# Patient Record
Sex: Female | Born: 1985 | Race: White | Hispanic: No | Marital: Single | Smoking: Current every day smoker
Health system: Southern US, Community
[De-identification: ages and names within clinical notes are randomized; demographics above are authoritative.]

## PROBLEM LIST (undated history)

## (undated) DIAGNOSIS — F1920 Other psychoactive substance dependence, uncomplicated: Secondary | ICD-10-CM

## (undated) HISTORY — PX: TUBAL LIGATION: SHX77

## (undated) HISTORY — PX: CARPAL TUNNEL RELEASE: SHX101

---
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
## 2008-11-28 ENCOUNTER — Emergency Department (HOSPITAL_BASED_OUTPATIENT_CLINIC_OR_DEPARTMENT_OTHER): Admission: EM | Admit: 2008-11-28 | Discharge: 2008-11-28 | Payer: Self-pay | Admitting: Emergency Medicine

## 2008-11-28 ENCOUNTER — Ambulatory Visit: Payer: Self-pay | Admitting: Diagnostic Radiology

## 2008-12-08 ENCOUNTER — Emergency Department (HOSPITAL_BASED_OUTPATIENT_CLINIC_OR_DEPARTMENT_OTHER): Admission: EM | Admit: 2008-12-08 | Discharge: 2008-12-08 | Payer: Self-pay | Admitting: Emergency Medicine

## 2010-04-21 LAB — URINALYSIS, ROUTINE W REFLEX MICROSCOPIC
Bilirubin Urine: NEGATIVE
Bilirubin Urine: NEGATIVE
Glucose, UA: NEGATIVE mg/dL
Ketones, ur: NEGATIVE mg/dL
Nitrite: NEGATIVE
Nitrite: NEGATIVE
Protein, ur: NEGATIVE mg/dL
Specific Gravity, Urine: 1.007 (ref 1.005–1.030)
pH: 6.5 (ref 5.0–8.0)
pH: 7 (ref 5.0–8.0)

## 2010-04-21 LAB — PREGNANCY, URINE: Preg Test, Ur: NEGATIVE

## 2010-04-21 LAB — URINE MICROSCOPIC-ADD ON

## 2012-06-01 ENCOUNTER — Emergency Department (HOSPITAL_BASED_OUTPATIENT_CLINIC_OR_DEPARTMENT_OTHER): Payer: BC Managed Care – PPO

## 2012-06-01 ENCOUNTER — Encounter (HOSPITAL_BASED_OUTPATIENT_CLINIC_OR_DEPARTMENT_OTHER): Payer: Self-pay | Admitting: *Deleted

## 2012-06-01 ENCOUNTER — Inpatient Hospital Stay (HOSPITAL_BASED_OUTPATIENT_CLINIC_OR_DEPARTMENT_OTHER)
Admission: EM | Admit: 2012-06-01 | Discharge: 2012-06-04 | DRG: 277 | Disposition: A | Discharge status: Home / Self Care | Payer: BC Managed Care – PPO | Attending: Internal Medicine | Admitting: Internal Medicine

## 2012-06-01 DIAGNOSIS — L0390 Cellulitis, unspecified: Secondary | ICD-10-CM | POA: Diagnosis present

## 2012-06-01 DIAGNOSIS — IMO0002 Reserved for concepts with insufficient information to code with codable children: Principal | ICD-10-CM | POA: Diagnosis present

## 2012-06-01 DIAGNOSIS — F17200 Nicotine dependence, unspecified, uncomplicated: Secondary | ICD-10-CM | POA: Diagnosis present

## 2012-06-01 DIAGNOSIS — Z881 Allergy status to other antibiotic agents status: Secondary | ICD-10-CM

## 2012-06-01 DIAGNOSIS — L0291 Cutaneous abscess, unspecified: Secondary | ICD-10-CM | POA: Diagnosis present

## 2012-06-01 DIAGNOSIS — F1110 Opioid abuse, uncomplicated: Secondary | ICD-10-CM | POA: Diagnosis present

## 2012-06-01 NOTE — ED Notes (Signed)
Pt c/o abscess to right hand x 3 days

## 2012-06-01 NOTE — ED Provider Notes (Signed)
History     CSN: [id number]  Arrival date & time 06/01/12  2116   First MD Initiated Contact with Patient 06/01/12 2301      Chief Complaint  Patient presents with  . Abscess    (Consider location/radiation/quality/duration/timing/severity/associated sxs/prior treatment) Patient is a 27 y.o. female presenting with abscess. The history is provided by the patient.  Abscess Location:  Shoulder/arm Shoulder/arm abscess location:  R forearm Abscess quality: induration, itching and painful   Red streaking: no   Duration:  5 days Progression:  Worsening Pain details:    Quality:  Pressure   Severity:  Severe   Timing:  Constant   Progression:  Worsening Context: injected drug use   Relieved by:  Nothing Worsened by:  Nothing tried Ineffective treatments:  None tried Associated symptoms: no fever     History reviewed. No pertinent past medical history.  History reviewed. No pertinent past surgical history.  History reviewed. No pertinent family history.  History  Substance Use Topics  . Smoking status: Current Every Day Smoker -- 0.50 packs/day    Types: Cigarettes  . Smokeless tobacco: Not on file  . Alcohol Use: No    OB History   Grav Para Term Preterm Abortions TAB SAB Ect Mult Living                  Review of Systems  Constitutional: Negative for fever.  All other systems reviewed and are negative.    Allergies  Augmentin  Home Medications  No current outpatient prescriptions on file.  BP 126/75  Pulse 89  Temp(Src) 98.7 F (37.1 C) (Oral)  Resp 16  Ht 5\' 7"  (1.702 m)  Wt 150 lb (68.04 kg)  BMI 23.49 kg/m2  SpO2 100%  Physical Exam  Constitutional: She is oriented to person, place, and time. She appears well-developed and well-nourished. No distress.  HENT:  Head: Normocephalic and atraumatic.  Mouth/Throat: Oropharynx is clear and moist.  Eyes: Conjunctivae are normal. Pupils are equal, round, and reactive to light.  Neck: Normal range  of motion. Neck supple.  Cardiovascular: Normal rate, regular rhythm and intact distal pulses.   Pulmonary/Chest: Effort normal and breath sounds normal. She has no wheezes. She has no rales.  Abdominal: Soft. Bowel sounds are normal. There is no tenderness. There is no rebound and no guarding.  Musculoskeletal:       Arms: Neurological: She is alert and oriented to person, place, and time.  Skin: Skin is warm and dry.  Psychiatric: She has a normal mood and affect.    ED Course  Procedures (including critical care time)  Labs Reviewed - No data to display No results found.   No diagnosis found.    MDM  Admit to medicine Case d/w hand surgery Dr. Georg Ruddle Results for orders placed during the hospital encounter of 06/01/12  CBC WITH DIFFERENTIAL      Result Value Range   WBC 9.5  4.0 - 10.5 K/uL   RBC 4.09  3.87 - 5.11 MIL/uL   Hemoglobin 12.7  12.0 - 15.0 g/dL   HCT 40.9  81.1 - 91.4 %   MCV 92.2  78.0 - 100.0 fL   MCH 31.1  26.0 - 34.0 pg   MCHC 33.7  30.0 - 36.0 g/dL   RDW 78.2  95.6 - 21.3 %   Platelets 227  150 - 400 K/uL   Neutrophils Relative % 64  43 - 77 %   Neutro Abs 6.1  1.7 -  7.7 K/uL   Lymphocytes Relative 25  12 - 46 %   Lymphs Abs 2.3  0.7 - 4.0 K/uL   Monocytes Relative 8  3 - 12 %   Monocytes Absolute 0.8  0.1 - 1.0 K/uL   Eosinophils Relative 3  0 - 5 %   Eosinophils Absolute 0.3  0.0 - 0.7 K/uL   Basophils Relative 0  0 - 1 %   Basophils Absolute 0.0  0.0 - 0.1 K/uL  BASIC METABOLIC PANEL      Result Value Range   Sodium 141  135 - 145 mEq/L   Potassium 3.9  3.5 - 5.1 mEq/L   Chloride 104  96 - 112 mEq/L   CO2 27  19 - 32 mEq/L   Glucose, Bld 88  70 - 99 mg/dL   BUN 6  6 - 23 mg/dL   Creatinine, Ser 4.09  0.50 - 1.10 mg/dL   Calcium 9.6  8.4 - 81.1 mg/dL   GFR calc non Af Amer >90  >90 mL/min   GFR calc Af Amer >90  >90 mL/min  PREGNANCY, URINE      Result Value Range   Preg Test, Ur NEGATIVE  NEGATIVE   Dg Forearm Right  06/01/2012    *RADIOLOGY REPORT*  Clinical Data: Abscess right anterior lateral wrist region  RIGHT FOREARM - 2 VIEW  Comparison: None  Findings: There is diffuse soft tissue swelling along the dorsum of the distal forearm.aa  There is no acute fracture or subluxation identified.  No focal bony erosions identified.  There is no radiopaque foreign bodies or soft tissue calcifications.  IMPRESSION:  1.  Dorsal soft tissue swelling.   Original Report Authenticated By: Signa Kell, M.D.     Medications  nicotine (NICODERM CQ - dosed in mg/24 hours) patch 21 mg (21 mg Transdermal Patch Applied 06/02/12 0239)  vancomycin (VANCOCIN) IVPB 1000 mg/200 mL premix (1,000 mg Intravenous New Bag/Given 06/02/12 0242)  levofloxacin (LEVAQUIN) IVPB 500 mg (0 mg Intravenous Stopped 06/02/12 0240)  TDaP (BOOSTRIX) injection 0.5 mL (0.5 mLs Intramuscular Given 06/02/12 0216)  ketorolac (TORADOL) 30 MG/ML injection 30 mg (30 mg Intravenous Given 06/02/12 0217)  fentaNYL (SUBLIMAZE) injection 100 mcg (100 mcg Intravenous Given 06/02/12 0217)           Dang Mathison K Lilianne Delair-Rasch, MD 06/02/12 [phone]

## 2012-06-02 ENCOUNTER — Inpatient Hospital Stay (HOSPITAL_COMMUNITY): Payer: BC Managed Care – PPO

## 2012-06-02 ENCOUNTER — Inpatient Hospital Stay (HOSPITAL_COMMUNITY): Payer: BC Managed Care – PPO | Admitting: Anesthesiology

## 2012-06-02 ENCOUNTER — Encounter (HOSPITAL_COMMUNITY)
Admission: EM | Disposition: A | Discharge status: Home / Self Care | Payer: Self-pay | Source: Home / Self Care | Attending: Internal Medicine

## 2012-06-02 ENCOUNTER — Encounter (HOSPITAL_COMMUNITY): Payer: Self-pay | Admitting: Anesthesiology

## 2012-06-02 DIAGNOSIS — L0390 Cellulitis, unspecified: Secondary | ICD-10-CM | POA: Diagnosis present

## 2012-06-02 DIAGNOSIS — F17200 Nicotine dependence, unspecified, uncomplicated: Secondary | ICD-10-CM

## 2012-06-02 DIAGNOSIS — F1110 Opioid abuse, uncomplicated: Secondary | ICD-10-CM

## 2012-06-02 DIAGNOSIS — L0291 Cutaneous abscess, unspecified: Secondary | ICD-10-CM

## 2012-06-02 HISTORY — PX: I & D EXTREMITY: SHX5045

## 2012-06-02 LAB — CBC
MCH: 30 pg (ref 26.0–34.0)
MCV: 90.3 fL (ref 78.0–100.0)
Platelets: 212 10*3/uL (ref 150–400)
RDW: 13.3 % (ref 11.5–15.5)

## 2012-06-02 LAB — BASIC METABOLIC PANEL
BUN: 6 mg/dL (ref 6–23)
CO2: 27 mEq/L (ref 19–32)
CO2: 23 mEq/L (ref 19–32)
Calcium: 9 mg/dL (ref 8.4–10.5)
Chloride: 104 mEq/L (ref 96–112)
Creatinine, Ser: 0.59 mg/dL (ref 0.50–1.10)
Glucose, Bld: 88 mg/dL (ref 70–99)
Glucose, Bld: 100 mg/dL — ABNORMAL HIGH (ref 70–99)
Potassium: 3.9 mEq/L (ref 3.5–5.1)

## 2012-06-02 LAB — CBC WITH DIFFERENTIAL/PLATELET
Hemoglobin: 12.7 g/dL (ref 12.0–15.0)
Lymphs Abs: 2.3 10*3/uL (ref 0.7–4.0)
Monocytes Relative: 8 % (ref 3–12)
Neutro Abs: 6.1 10*3/uL (ref 1.7–7.7)
Neutrophils Relative %: 64 % (ref 43–77)
RBC: 4.09 MIL/uL (ref 3.87–5.11)

## 2012-06-02 SURGERY — IRRIGATION AND DEBRIDEMENT EXTREMITY
Anesthesia: General | Site: Arm Lower | Laterality: Right | Wound class: Dirty or Infected

## 2012-06-02 MED ORDER — LEVOFLOXACIN IN D5W 500 MG/100ML IV SOLN
500.0000 mg | Freq: Once | INTRAVENOUS | Status: AC
  Administered 2012-06-02: 500 mg via INTRAVENOUS
  Filled 2012-06-02: qty 100

## 2012-06-02 MED ORDER — NICOTINE 21 MG/24HR TD PT24
21.0000 mg | MEDICATED_PATCH | Freq: Once | TRANSDERMAL | Status: AC
  Administered 2012-06-02: 21 mg via TRANSDERMAL
  Filled 2012-06-02 (×2): qty 1

## 2012-06-02 MED ORDER — DIPHENHYDRAMINE HCL 50 MG/ML IJ SOLN
25.0000 mg | Freq: Four times a day (QID) | INTRAMUSCULAR | Status: DC | PRN
  Administered 2012-06-02 – 2012-06-03 (×2): 25 mg via INTRAVENOUS
  Filled 2012-06-02: qty 1

## 2012-06-02 MED ORDER — SODIUM CHLORIDE 0.9 % IV SOLN
INTRAVENOUS | Status: DC
  Administered 2012-06-02: 22:00:00 via INTRAVENOUS
  Administered 2012-06-02: 125 mL/h via INTRAVENOUS
  Administered 2012-06-03: 07:00:00 via INTRAVENOUS

## 2012-06-02 MED ORDER — FENTANYL CITRATE 0.05 MG/ML IJ SOLN
INTRAMUSCULAR | Status: DC | PRN
  Administered 2012-06-02: 50 ug via INTRAVENOUS
  Administered 2012-06-02 (×2): 100 ug via INTRAVENOUS

## 2012-06-02 MED ORDER — VANCOMYCIN HCL IN DEXTROSE 1-5 GM/200ML-% IV SOLN
1000.0000 mg | Freq: Once | INTRAVENOUS | Status: AC
  Administered 2012-06-02: 1000 mg via INTRAVENOUS
  Filled 2012-06-02: qty 200

## 2012-06-02 MED ORDER — HYDROMORPHONE HCL PF 1 MG/ML IJ SOLN
INTRAMUSCULAR | Status: AC
  Filled 2012-06-02: qty 1

## 2012-06-02 MED ORDER — DIPHENHYDRAMINE HCL 50 MG/ML IJ SOLN
INTRAMUSCULAR | Status: AC
  Filled 2012-06-02: qty 1

## 2012-06-02 MED ORDER — ENOXAPARIN SODIUM 40 MG/0.4ML ~~LOC~~ SOLN
40.0000 mg | Freq: Every day | SUBCUTANEOUS | Status: DC
  Administered 2012-06-02: 40 mg via SUBCUTANEOUS
  Filled 2012-06-02 (×2): qty 0.4

## 2012-06-02 MED ORDER — ACETAMINOPHEN 650 MG RE SUPP: 650.0000 mg | Freq: Four times a day (QID) | RECTAL | Status: DC | PRN

## 2012-06-02 MED ORDER — SUCCINYLCHOLINE CHLORIDE 20 MG/ML IJ SOLN
INTRAMUSCULAR | Status: DC | PRN
  Administered 2012-06-02: 120 mg via INTRAVENOUS

## 2012-06-02 MED ORDER — ONDANSETRON HCL 4 MG/2ML IJ SOLN
4.0000 mg | Freq: Four times a day (QID) | INTRAMUSCULAR | Status: DC | PRN
  Administered 2012-06-02: 4 mg via INTRAVENOUS
  Filled 2012-06-02: qty 2

## 2012-06-02 MED ORDER — ONDANSETRON HCL 4 MG PO TABS: 4.0000 mg | ORAL_TABLET | Freq: Four times a day (QID) | ORAL | Status: DC | PRN

## 2012-06-02 MED ORDER — GADOBENATE DIMEGLUMINE 529 MG/ML IV SOLN
15.0000 mL | Freq: Once | INTRAVENOUS | Status: AC | PRN
  Administered 2012-06-02: 15 mL via INTRAVENOUS

## 2012-06-02 MED ORDER — HYDROMORPHONE HCL PF 1 MG/ML IJ SOLN
0.2500 mg | INTRAMUSCULAR | Status: DC | PRN
  Administered 2012-06-02 (×4): 0.5 mg via INTRAVENOUS

## 2012-06-02 MED ORDER — SODIUM CHLORIDE 0.9 % IR SOLN
Status: DC | PRN
  Administered 2012-06-02: 1000 mL

## 2012-06-02 MED ORDER — CLINDAMYCIN PHOSPHATE 600 MG/50ML IV SOLN
600.0000 mg | Freq: Four times a day (QID) | INTRAVENOUS | Status: DC
  Administered 2012-06-02 – 2012-06-03 (×5): 600 mg via INTRAVENOUS
  Filled 2012-06-02 (×7): qty 50

## 2012-06-02 MED ORDER — MEPERIDINE HCL 25 MG/ML IJ SOLN: 6.2500 mg | INTRAMUSCULAR | Status: DC | PRN

## 2012-06-02 MED ORDER — OXYCODONE HCL 5 MG/5ML PO SOLN: 5.0000 mg | Freq: Once | ORAL | Status: AC | PRN

## 2012-06-02 MED ORDER — KETOROLAC TROMETHAMINE 30 MG/ML IJ SOLN
30.0000 mg | Freq: Once | INTRAMUSCULAR | Status: AC
  Administered 2012-06-02: 30 mg via INTRAVENOUS
  Filled 2012-06-02: qty 1

## 2012-06-02 MED ORDER — OXYCODONE HCL 5 MG PO TABS: 5.0000 mg | ORAL_TABLET | Freq: Once | ORAL | Status: AC | PRN

## 2012-06-02 MED ORDER — MIDAZOLAM HCL 5 MG/5ML IJ SOLN
INTRAMUSCULAR | Status: DC | PRN
  Administered 2012-06-02: 2 mg via INTRAVENOUS

## 2012-06-02 MED ORDER — OXYCODONE HCL 5 MG PO TABS
5.0000 mg | ORAL_TABLET | ORAL | Status: DC | PRN
  Administered 2012-06-02 – 2012-06-04 (×8): 5 mg via ORAL
  Filled 2012-06-02 (×8): qty 1

## 2012-06-02 MED ORDER — FENTANYL CITRATE 0.05 MG/ML IJ SOLN
100.0000 ug | Freq: Once | INTRAMUSCULAR | Status: AC
  Administered 2012-06-02: 100 ug via INTRAVENOUS
  Filled 2012-06-02: qty 2

## 2012-06-02 MED ORDER — ZOLPIDEM TARTRATE 5 MG PO TABS: 5.0000 mg | ORAL_TABLET | Freq: Every evening | ORAL | Status: DC | PRN

## 2012-06-02 MED ORDER — LIDOCAINE HCL (CARDIAC) 20 MG/ML IV SOLN
INTRAVENOUS | Status: DC | PRN
  Administered 2012-06-02: 100 mg via INTRAVENOUS

## 2012-06-02 MED ORDER — ONDANSETRON HCL 4 MG/2ML IJ SOLN: 4.0000 mg | Freq: Once | INTRAMUSCULAR | Status: AC | PRN

## 2012-06-02 MED ORDER — LACTATED RINGERS IV SOLN
INTRAVENOUS | Status: DC | PRN
  Administered 2012-06-02: 17:00:00 via INTRAVENOUS

## 2012-06-02 MED ORDER — TETANUS-DIPHTH-ACELL PERTUSSIS 5-2.5-18.5 LF-MCG/0.5 IM SUSP
0.5000 mL | Freq: Once | INTRAMUSCULAR | Status: AC
  Administered 2012-06-02: 0.5 mL via INTRAMUSCULAR
  Filled 2012-06-02: qty 0.5

## 2012-06-02 MED ORDER — NAPROXEN 500 MG PO TABS
500.0000 mg | ORAL_TABLET | Freq: Two times a day (BID) | ORAL | Status: DC
  Administered 2012-06-02 – 2012-06-04 (×4): 500 mg via ORAL
  Filled 2012-06-02 (×6): qty 1

## 2012-06-02 MED ORDER — ALUM & MAG HYDROXIDE-SIMETH 200-200-20 MG/5ML PO SUSP: 30.0000 mL | Freq: Four times a day (QID) | ORAL | Status: DC | PRN

## 2012-06-02 MED ORDER — ACETAMINOPHEN 325 MG PO TABS
650.0000 mg | ORAL_TABLET | Freq: Four times a day (QID) | ORAL | Status: DC | PRN
  Administered 2012-06-02 – 2012-06-04 (×3): 650 mg via ORAL
  Filled 2012-06-02 (×3): qty 2

## 2012-06-02 MED ORDER — HYDROMORPHONE HCL PF 1 MG/ML IJ SOLN
0.5000 mg | INTRAMUSCULAR | Status: DC | PRN
  Administered 2012-06-02 – 2012-06-04 (×9): 1 mg via INTRAVENOUS
  Filled 2012-06-02 (×11): qty 1

## 2012-06-02 MED ORDER — PROPOFOL 10 MG/ML IV BOLUS
INTRAVENOUS | Status: DC | PRN
  Administered 2012-06-02: 150 mg via INTRAVENOUS

## 2012-06-02 MED ORDER — NICOTINE 14 MG/24HR TD PT24
14.0000 mg | MEDICATED_PATCH | Freq: Every day | TRANSDERMAL | Status: DC
  Administered 2012-06-03 – 2012-06-04 (×3): 14 mg via TRANSDERMAL
  Filled 2012-06-02 (×4): qty 1

## 2012-06-02 SURGICAL SUPPLY — 49 items
BANDAGE ELASTIC 3 VELCRO ST LF (GAUZE/BANDAGES/DRESSINGS) IMPLANT
BANDAGE ELASTIC 4 VELCRO ST LF (GAUZE/BANDAGES/DRESSINGS) ×1 IMPLANT
BANDAGE GAUZE ELAST BULKY 4 IN (GAUZE/BANDAGES/DRESSINGS) ×3 IMPLANT
BNDG CMPR 9X4 STRL LF SNTH (GAUZE/BANDAGES/DRESSINGS) ×1
BNDG COHESIVE 4X5 TAN STRL (GAUZE/BANDAGES/DRESSINGS) ×1 IMPLANT
BNDG ESMARK 4X9 LF (GAUZE/BANDAGES/DRESSINGS) ×1 IMPLANT
CHLORAPREP W/TINT 26ML (MISCELLANEOUS) ×2 IMPLANT
CLOTH BEACON ORANGE TIMEOUT ST (SAFETY) ×2 IMPLANT
COVER SURGICAL LIGHT HANDLE (MISCELLANEOUS) ×2 IMPLANT
CUFF TOURNIQUET SINGLE 18IN (TOURNIQUET CUFF) ×1 IMPLANT
CUFF TOURNIQUET SINGLE 24IN (TOURNIQUET CUFF) IMPLANT
DRAIN PENROSE 1/4X12 LTX STRL (WOUND CARE) ×1 IMPLANT
DRAPE SURG 17X23 STRL (DRAPES) ×2 IMPLANT
DRSG ADAPTIC 3X8 NADH LF (GAUZE/BANDAGES/DRESSINGS) ×2 IMPLANT
ELECT REM PT RETURN 9FT ADLT (ELECTROSURGICAL)
ELECTRODE REM PT RTRN 9FT ADLT (ELECTROSURGICAL) IMPLANT
EVACUATOR 1/8 PVC DRAIN (DRAIN) IMPLANT
GLOVE BIO SURGEON STRL SZ7.5 (GLOVE) ×2 IMPLANT
GLOVE BIOGEL PI IND STRL 8 (GLOVE) ×1 IMPLANT
GLOVE BIOGEL PI INDICATOR 8 (GLOVE) ×1
GOWN PREVENTION PLUS XXLARGE (GOWN DISPOSABLE) ×2 IMPLANT
GOWN STRL NON-REIN LRG LVL3 (GOWN DISPOSABLE) ×6 IMPLANT
HANDPIECE INTERPULSE COAX TIP (DISPOSABLE) ×2
KIT BASIN OR (CUSTOM PROCEDURE TRAY) ×2 IMPLANT
KIT ROOM TURNOVER OR (KITS) ×2 IMPLANT
MANIFOLD NEPTUNE II (INSTRUMENTS) ×2 IMPLANT
NS IRRIG 1000ML POUR BTL (IV SOLUTION) ×2 IMPLANT
PACK ORTHO EXTREMITY (CUSTOM PROCEDURE TRAY) ×2 IMPLANT
PAD ARMBOARD 7.5X6 YLW CONV (MISCELLANEOUS) ×4 IMPLANT
PAD CAST 3X4 CTTN HI CHSV (CAST SUPPLIES) IMPLANT
PAD CAST 4YDX4 CTTN HI CHSV (CAST SUPPLIES) IMPLANT
PADDING CAST COTTON 3X4 STRL (CAST SUPPLIES) ×2
PADDING CAST COTTON 4X4 STRL (CAST SUPPLIES) ×2
SET HNDPC FAN SPRY TIP SCT (DISPOSABLE) IMPLANT
SPLINT FIBERGLASS 3X12 (CAST SUPPLIES) ×1 IMPLANT
SPONGE GAUZE 4X4 12PLY (GAUZE/BANDAGES/DRESSINGS) ×2 IMPLANT
SPONGE LAP 18X18 X RAY DECT (DISPOSABLE) IMPLANT
STOCKINETTE IMPERVIOUS 9X36 MD (GAUZE/BANDAGES/DRESSINGS) ×1 IMPLANT
SUT ETHILON 3 0 PS 1 (SUTURE) ×1 IMPLANT
SUT ETHILON 4 0 PS 2 18 (SUTURE) IMPLANT
SUT VICRYL RAPIDE 4/0 PS 2 (SUTURE) IMPLANT
SWAB CULTURE LIQUID MINI MALE (MISCELLANEOUS) ×1 IMPLANT
TOWEL OR 17X24 6PK STRL BLUE (TOWEL DISPOSABLE) ×2 IMPLANT
TOWEL OR 17X26 10 PK STRL BLUE (TOWEL DISPOSABLE) ×2 IMPLANT
TUBE ANAEROBIC SPECIMEN COL (MISCELLANEOUS) ×1 IMPLANT
TUBE CONNECTING 12X1/4 (SUCTIONS) ×2 IMPLANT
UNDERPAD 30X30 INCONTINENT (UNDERPADS AND DIAPERS) ×2 IMPLANT
WATER STERILE IRR 1000ML POUR (IV SOLUTION) ×2 IMPLANT
YANKAUER SUCT BULB TIP NO VENT (SUCTIONS) ×2 IMPLANT

## 2012-06-02 NOTE — ED Notes (Signed)
MD at bedside. 

## 2012-06-02 NOTE — Anesthesia Procedure Notes (Signed)
Procedure Name: Intubation Date/Time: 06/02/2012 5:28 PM Performed by: Alanda Amass A Pre-anesthesia Checklist: Patient identified, Timeout performed, Emergency Drugs available, Suction available and Patient being monitored Patient Re-evaluated:Patient Re-evaluated prior to inductionOxygen Delivery Method: Circle system utilized Preoxygenation: Pre-oxygenation with 100% oxygen Intubation Type: IV induction, Rapid sequence and Cricoid Pressure applied Laryngoscope Size: Mac and 3 Grade View: Grade I Tube type: Oral Tube size: 7.5 mm Number of attempts: 1 Airway Equipment and Method: Stylet Placement Confirmation: ETT inserted through vocal cords under direct vision,  breath sounds checked- equal and bilateral and positive ETCO2 Secured at: 22 cm Tube secured with: Tape Dental Injury: Teeth and Oropharynx as per pre-operative assessment

## 2012-06-02 NOTE — Transfer of Care (Signed)
Immediate Anesthesia Transfer of Care Note  Patient: Janice Harrison  Procedure(s) Performed: Procedure(s): IRRIGATION AND DEBRIDEMENT EXTREMITY (Right)  Patient Location: PACU  Anesthesia Type:General  Level of Consciousness: awake  Airway & Oxygen Therapy: Patient Spontanous Breathing  Post-op Assessment: Report given to PACU RN and Post -op Vital signs reviewed and stable  Post vital signs: Reviewed and stable  Complications: No apparent anesthesia complications

## 2012-06-02 NOTE — Progress Notes (Signed)
Dr Rhona Leavens returned page, pt has regular diet until decision is made to operate or not.

## 2012-06-02 NOTE — Anesthesia Postprocedure Evaluation (Signed)
Anesthesia Post Note  Patient: Janice Harrison  Procedure(s) Performed: Procedure(s) (LRB): IRRIGATION AND DEBRIDEMENT EXTREMITY (Right)  Anesthesia type: general  Patient location: PACU  Post pain: Pain level controlled  Post assessment: Patient's Cardiovascular Status Stable  Last Vitals:  Filed Vitals:   06/02/12 1809  BP: 116/80  Pulse: 122  Temp:   Resp: 18    Post vital signs: Reviewed and stable  Level of consciousness: sedated  Complications: No apparent anesthesia complications

## 2012-06-02 NOTE — Progress Notes (Signed)
[facility] PROGRESS NOTE  Janice Harrison ZOX:[id number] DOB: 1985/08/20 DOA: 06/01/2012 PCP: No primary provider on file.  Assessment/Plan: 1. Right wrist cellulitis/abscess: As mentioned in the H&P, the patient will be seen by the hand surgeon. There is no active drainage at this time. For now, we will continue patient's current antibiotics including vancomycin, clindamycin as well as Levaquin. 2.  polysubstance abuse: As per H&P, the patient has a history of heroin abuse. She will benefit greatly from a substance abuse program. 3. DVT prophylaxis: Continue Lovenox as tolerated 4. FEN: For the time being, we'll keep the patient n.p.o. pending recommendations from surgery.  Code Status: Full Family Communication: Patient in room (indicate person spoken with, relationship, and if by phone, the number) Disposition Plan: Pending   Consultants:  Orthopedic surgery  Procedures:  Pending  Antibiotics:  Vancomycin 06/02/12>>>  Levaquin 06/02/12>>>  Clindamycin 06/02/12>>>  HPI/Subjective: Patient complains of right wrist pain and swelling.  Objective: Filed Vitals:   06/01/12 2120 06/02/12 0425  BP: 126/75 114/60  Pulse: 89 77  Temp: 98.7 F (37.1 C) 98.2 F (36.8 C)  TempSrc: Oral   Resp: 16 18  Height: 5\' 7"  (1.702 m)   Weight: 68.04 kg (150 lb)   SpO2: 100% 100%   No intake or output data in the 24 hours ending 06/02/12 0930 Filed Weights   06/01/12 2120  Weight: 68.04 kg (150 lb)    Exam:   General:  Patient's awake in no apparent distress  Cardiovascular: Regular S1-S2  Respiratory: Normal respiratory effort, no crackles no wheeze  Abdomen: Soft positive bowel sound Musculoskeletal: No clubbing or cyanosis, distally well Data Reviewed: Basic Metabolic Panel:  Recent Labs Lab 06/02/12 0040  NA 141  K 3.9  CL 104  CO2 27  GLUCOSE 88  BUN 6  CREATININE 0.60  CALCIUM 9.6   Liver Function Tests: No results found for this basename: AST,  ALT, ALKPHOS, BILITOT, PROT, ALBUMIN,  in the last 168 hours No results found for this basename: LIPASE, AMYLASE,  in the last 168 hours No results found for this basename: AMMONIA,  in the last 168 hours CBC:  Recent Labs Lab 06/02/12 0040 06/02/12 0845  WBC 9.5 7.2  NEUTROABS 6.1  --   HGB 12.7 12.1  HCT 37.7 36.5  MCV 92.2 90.3  PLT 227 212   Cardiac Enzymes: No results found for this basename: CKTOTAL, CKMB, CKMBINDEX, TROPONINI,  in the last 168 hours BNP (last 3 results) No results found for this basename: PROBNP,  in the last 8760 hours CBG: No results found for this basename: GLUCAP,  in the last 168 hours  No results found for this or any previous visit (from the past 240 hour(s)).   Studies: Dg Forearm Right  06/01/2012   *RADIOLOGY REPORT*  Clinical Data: Abscess right anterior lateral wrist region  RIGHT FOREARM - 2 VIEW  Comparison: None  Findings: There is diffuse soft tissue swelling along the dorsum of the distal forearm.aa  There is no acute fracture or subluxation identified.  No focal bony erosions identified.  There is no radiopaque foreign bodies or soft tissue calcifications.  IMPRESSION:  1.  Dorsal soft tissue swelling.   Original Report Authenticated By: Signa Kell, M.D.    Scheduled Meds: . clindamycin (CLEOCIN) IV  600 mg Intravenous Q6H  . enoxaparin (LOVENOX) injection  40 mg Subcutaneous Daily  . nicotine  14 mg Transdermal Daily  . nicotine  21 mg Transdermal Once   Continuous  Infusions: . sodium chloride 125 mL/hr (06/02/12 0924)    Principal Problem:   Cellulitis and abscess Active Problems:   Heroin abuse   Tobacco use disorder    Time spent: 25 minutes    CHIU, STEPHEN K  [facility] Pager [phone]. If 7PM-7AM, please contact night-coverage at [email or web address], password [facility] 06/02/2012, 9:30 AM  LOS: 1 day

## 2012-06-02 NOTE — Consult Note (Signed)
ORTHOPAEDIC CONSULTATION  REQUESTING PHYSICIAN: Jerald Kief, MD  Chief Complaint: right forearm swelling  HPI: Janice Harrison is a 27 y.o. female who complains of  Right forearm pain and swelling.  She was admitted thru ED to hospitalists overnight for IV antibiotics and concern of dorsal wrist abscess.  Events originally conveyed to me were that a self-drainage (rupturing) procedure had been performed, so ED did no additional I&D.  I was c/s by hospitalist today around lunchtime to evaluate this patient.  MRI was requested and has been performed.  Patient is not systemically ill.  History reviewed. No pertinent past medical history. History reviewed. No pertinent past surgical history. History   Social History  . Marital Status: Single    Spouse Name: N/A    Number of Children: N/A  . Years of Education: N/A   Social History Main Topics  . Smoking status: Current Every Day Smoker -- 0.50 packs/day    Types: Cigarettes  . Smokeless tobacco: None  . Alcohol Use: No  . Drug Use: Yes    Special: IV  . Sexually Active: No   Other Topics Concern  . None   Social History Narrative  . None   History reviewed. No pertinent family history. Allergies  Allergen Reactions  . Augmentin (Amoxicillin-Pot Clavulanate)   . Vancomycin     Itching   Prior to Admission medications   Medication Sig Start Date End Date Taking? Authorizing Provider  B Complex-Biotin-FA (B-COMPLEX PO) Take 1 tablet by mouth daily.   Yes Historical Provider, MD  Cyanocobalamin (VITAMIN B 12 PO) Take 2 tablets by mouth daily.   Yes Historical Provider, MD  ibuprofen (ADVIL,MOTRIN) 200 MG tablet Take 600 mg by mouth 2 (two) times daily as needed for pain or headache.   Yes Historical Provider, MD   Dg Forearm Right  06/01/2012   *RADIOLOGY REPORT*  Clinical Data: Abscess right anterior lateral wrist region  RIGHT FOREARM - 2 VIEW  Comparison: None  Findings: There is diffuse soft tissue swelling along the  dorsum of the distal forearm.aa  There is no acute fracture or subluxation identified.  No focal bony erosions identified.  There is no radiopaque foreign bodies or soft tissue calcifications.  IMPRESSION:  1.  Dorsal soft tissue swelling.   Original Report Authenticated By: Signa Kell, M.D.   Mr Wrist Right W Wo Contrast  06/02/2012   *RADIOLOGY REPORT*  Clinical Data: Abscess and cellulitis in the right forearm and wrist.  MRI OF THE RIGHT WRIST WITHOUT AND WITH CONTRAST  Technique:  Multiplanar, multisequence MR imaging was performed both before and after administration of intravenous contrast.  Contrast: 15mL MULTIHANCE GADOBENATE DIMEGLUMINE 529 MG/ML IV SOLN  Comparison: Radiographs dated 06/01/2012  Findings: There is extensive cellulitis primarily of the dorsum of the hand and of the dorsum of the forearm.  There are multiple pockets of pus extensively on the dorsum of the hand and just superficial to the extensor tendons.  Many of these areas of pus are not well defined.  There is a prominent 9 mm pus pocket on the volar aspect of the wrist dorsal to the triquetrum and in the subcutaneous fat.  There is another pus pocket dorsal to the distal radius 13 mm proximal to the radiocarpal joint.  This pocket measures 8 x 6 x 18 mm.  After contrast administration there is intense enhancement of the subcutaneous fat of the dorsum of the hand and extending almost circumferentially around the distal forearm consistent  with cellulitis.  There is enhancement around the extensor tendons on the dorsum of the hand.  No osseous abnormality.  No abnormal enhancement of radiocarpal joint or midcarpal joint or the carpal metacarpal joints.  IMPRESSION:   Extensive cellulitis with multiple areas of pus in the dorsum of the hand and distal forearm as described.  The cellulitis extends around the extensor tendons on the dorsum of the hand.   Original Report Authenticated By: Francene Boyers, M.D.   Mr Forearm Right Wo/w  Cm  06/02/2012   *RADIOLOGY REPORT*  Clinical Data: Cellulitis and abscess is of the forearm and hand.  MRI OF THE RIGHT FOREARM WITHOUT AND WITH CONTRAST  Technique:  Multiplanar, multisequence MR imaging was performed both before and after administration of intravenous contrast.  Contrast: 15mL MULTIHANCE GADOBENATE DIMEGLUMINE 529 MG/ML IV SOLN  Comparison: None.  Findings: There is extensive cellulitis on the dorsum of the forearm extending 16 cm proximal to the radiocarpal joint.  There is no osseous or muscle involvement.  There is a pus pocket just deep to the vein on the dorsum of the distal forearm just proximal to the radiocarpal joint.  There are also pus collections extending just superficial to the fascia of the dorsal muscles of the forearm.  This does extend 16 cm proximal to the radiocarpal joint. There is diffuse extensive cellulitis in the subcutaneous fat of the forearm.  IMPRESSION: Extensive cellulitis of the dorsum of the forearm with pus just superficial to the fascia of the extensor muscles extending from the wrist joint 16 cm proximally.  Focal abscess just dorsal to the distal radius.   Original Report Authenticated By: Francene Boyers, M.D.    Positive ROS: All other systems have been reviewed and were otherwise negative with the exception of those mentioned in the HPI and as above.  Physical Exam: Vitals: Refer to EMR. Constitutional:  WD, WN, NAD HEENT:  NCAT, EOMI Neuro/Psych:  Alert & oriented to person, place, and time; appropriate mood & affect Lymphatic: No generalized UE edema or lymphadenopathy Extremities / MSK:  The extremities are normal with respect to appearance, ranges of motion, joint stability, muscle strength/tone, sensation, & perfusion except as otherwise noted:   marked edema of SQ from dorsal hand extending proximally to mid forearm.  No crepitus.  Obvious central focus of fluctuance at level of carpus.  Full flexion of digits limited by tightness and  pain.  She can extend the digits reasonably well.  NVI.  Assessment: Right forearm abscess(es)  Plan: Discussed with patient and mom plan to incise and drain, possibly with more than one incision in OR shortly.  Will obtain fluid for culture.  Remainder of management per hospitalist and/or ID, including transition to outpatient antibiotic management and followup.  Cliffton Asters Janee Morn, MD     Mobile [phone] Orthopaedic & Hand Surgery [facility] Orthopaedic & Sports Medicine [facility] [address] [place], [place]  [address] [phone]

## 2012-06-02 NOTE — ED Notes (Signed)
Pt given meal and drink per MD approval.

## 2012-06-02 NOTE — Progress Notes (Signed)
Called ortho MD, Suzan Garibaldi, he stated he has not seen her and has not been consulted, she is under hospital care.  Paged hospitalist MD, will continue to follow.

## 2012-06-02 NOTE — Op Note (Signed)
06/01/2012 - 06/02/2012  6:10 PM  PATIENT:  Janice Harrison  27 y.o. female  PRE-OPERATIVE DIAGNOSIS:  Right distal forearm abscess  POST-OPERATIVE DIAGNOSIS:  Same  PROCEDURE:  Right distal forearm incision and drainage, acquisition of cultures  SURGEON: Cliffton Asters. Janee Morn, MD  PHYSICIAN ASSISTANT: None  ANESTHESIA:  general  SPECIMENS: Swabs to microbiology  DRAINS:   Penrose x 2  PREOPERATIVE INDICATIONS:  [name] is a  27 y.o. female with a diagnosis of right forearm abscess who failed conservative measures and elected for surgical management.    The risks benefits and alternatives were discussed with the patient preoperatively including but not limited to the risks of infection, bleeding, nerve injury, cardiopulmonary complications, the need for revision surgery, among others, and the patient verbalized understanding and consented to proceed.  OPERATIVE IMPLANTS: None  OPERATIVE FINDINGS: Thick purulent material from the wrist abscess, edematous fluid throughout the subcutaneous tissues  OPERATIVE PROCEDURE:  The patient was escorted to the operative theatre and placed in a supine position.  A surgical "time-out" was performed during which the planned procedure, proposed operative site, and the correct patient identity were compared to the operative consent and agreement confirmed by the circulating nurse according to current facility policy.  The exposed skin was prepped with Chloraprep and draped in the usual sterile fashion.  A tourniquet was not used.  A longitudinal incision was made over the apex of the dorsal wrist abscess and the small central area was elliptically excised. Thick purulent material was encountered just in the subcutaneous tissue, and cultures were obtained. Digital dissection was used to probe for any other abscesses in this region. In addition incision was made somewhat proximal to the first and subcutaneous digital dissection connected the tube. The  deep fascia at this point was also incised and there was no purulence within it. Everything was copiously irrigated and hemostasis obtained. The Penrose drains left in each wound and the wound is closed around the drain. A bulky dressing with a volar plaster component was applied and she was taken to room stable condition.  Disposition:  She will return to the floor to continue medical infection management.

## 2012-06-02 NOTE — H&P (Signed)
[facility] History and Physical  Odeal Welden WUJ:[id number] DOB: Feb 05, 1985 DOA: 06/01/2012  Referring physician: EDP PCP: No primary provider on file.  Specialists:   Chief Complaint: Swelling Redness and Pain Right ForeArm      HPI: Janice Harrison is a 27 y.o. female with a history of Heroin Abuse who presents to the ED with complaints of increased swelling redness and pain of her right Forearm and Wrist over the past week.   She denies having fever or chills.   She tried to drain the nodule herself but only yielded a small amount of pus.  She reports that she last used heroin 2 weeks ago.   She was evaluated in the ED at [facility] and placed on IV Vancomycin and IV Levaquin and transferred for admission.   The EDP contacted Ortho/Hand surgery for consultation (Dr. Mack Hook).      Review of Systems: The patient denies anorexia, fever, weight loss,, vision loss, decreased hearing, hoarseness, chest pain, syncope, dyspnea on exertion, peripheral edema, balance deficits, hemoptysis, abdominal pain, melena, hematochezia, severe indigestion/heartburn, hematuria, incontinence, genital sores, muscle weakness, suspicious skin lesions, transient blindness, difficulty walking, depression, unusual weight change, abnormal bleeding, enlarged lymph nodes, angioedema, and breast masses.    History reviewed. No pertinent past medical history. History reviewed. No pertinent past surgical history. Social History:  reports that she has been smoking Cigarettes.  She has been smoking about 0.50 packs per day. She does not have any smokeless tobacco history on file. She reports that she uses illicit drugs (IV). She reports that she does not drink alcohol.  where does patient live--home, ALF, SNF? and with whom if at home?  Can patient participate in ADLs?  Allergies  Allergen Reactions  . Augmentin (Amoxicillin-Pot Clavulanate)   . Vancomycin     Itching     Medications:  HOME  MEDS: Prior to Admission medications   Not on File    Allergies:  Allergies  Allergen Reactions  . Augmentin (Amoxicillin-Pot Clavulanate)   . Vancomycin     Itching    Social History:   reports that she has been smoking Cigarettes.  She has been smoking about 0.50 packs per day. She does not have any smokeless tobacco history on file. She reports that she uses illicit drugs (IV). She reports that she does not drink alcohol.  Family History: History reviewed. No pertinent family history.  Review of Systems:  The patient denies anorexia, fever, weight loss,, vision loss, decreased hearing, hoarseness, chest pain, syncope, dyspnea on exertion, peripheral edema, balance deficits, hemoptysis, abdominal pain, melena, hematochezia, severe indigestion/heartburn, hematuria, incontinence, genital sores, muscle weakness, suspicious skin lesions, transient blindness, difficulty walking, depression, unusual weight change, abnormal bleeding, enlarged lymph nodes, angioedema, and breast masses.   Physical Exam:  GEN:  Pleasant thinbut well developed 27 year old Caucasian Female examined  and in no acute distress; cooperative with exam Filed Vitals:   06/01/12 2120 06/02/12 0425  BP: 126/75 114/60  Pulse: 89 77  Temp: 98.7 F (37.1 C) 98.2 F (36.8 C)  TempSrc: Oral   Resp: 16 18  Height: 5\' 7"  (1.702 m)   Weight: 68.04 kg (150 lb)   SpO2: 100% 100%   Blood pressure 114/60, pulse 77, temperature 98.2 F (36.8 C), temperature source Oral, resp. rate 18, height 5\' 7"  (1.702 m), weight 68.04 kg (150 lb), SpO2 100.00%. PSYCH: She is alert and oriented x4; does not appear anxious does not appear depressed; affect is normal HEENT: Normocephalic and  Atraumatic, Mucous membranes pink; PERRLA; EOM intact; Fundi:  Benign;  No scleral icterus, Nares: Patent, Oropharynx: Clear, Fair Dentition, Neck:  FROM, no cervical lymphadenopathy nor thyromegaly or carotid bruit; no JVD; Breasts:: Not  examined CHEST WALL: No tenderness CHEST: Normal respiration, clear to auscultation bilaterally HEART: Regular rate and rhythm; no murmurs rubs or gallops BACK: No kyphosis or scoliosis; no CVA tenderness ABDOMEN: Positive Bowel Sounds, soft non-tender; no masses, no organomegaly, no pannus; no intertriginous candida. Rectal Exam: Not done EXTREMITIES: No cyanosis, clubbing or edema; no ulcerations. Genitalia: not examined PULSES: 2+ and symmetric SKIN: Normal hydration no rash except for erythematous nodule on Mid Right distal Forearm(1 cm diameter) CNS: Cranial nerves 2-12 grossly intact no focal neurologic deficit   Labs & Imaging Results for orders placed during the hospital encounter of 06/01/12 (from the past 48 hour(s))  CBC WITH DIFFERENTIAL     Status: None   Collection Time    06/02/12 12:40 AM      Result Value Range   WBC 9.5  4.0 - 10.5 K/uL   RBC 4.09  3.87 - 5.11 MIL/uL   Hemoglobin 12.7  12.0 - 15.0 g/dL   HCT 16.1  09.6 - 04.5 %   MCV 92.2  78.0 - 100.0 fL   MCH 31.1  26.0 - 34.0 pg   MCHC 33.7  30.0 - 36.0 g/dL   RDW 40.9  81.1 - 91.4 %   Platelets 227  150 - 400 K/uL   Neutrophils Relative % 64  43 - 77 %   Neutro Abs 6.1  1.7 - 7.7 K/uL   Lymphocytes Relative 25  12 - 46 %   Lymphs Abs 2.3  0.7 - 4.0 K/uL   Monocytes Relative 8  3 - 12 %   Monocytes Absolute 0.8  0.1 - 1.0 K/uL   Eosinophils Relative 3  0 - 5 %   Eosinophils Absolute 0.3  0.0 - 0.7 K/uL   Basophils Relative 0  0 - 1 %   Basophils Absolute 0.0  0.0 - 0.1 K/uL  BASIC METABOLIC PANEL     Status: None   Collection Time    06/02/12 12:40 AM      Result Value Range   Sodium 141  135 - 145 mEq/L   Potassium 3.9  3.5 - 5.1 mEq/L   Chloride 104  96 - 112 mEq/L   CO2 27  19 - 32 mEq/L   Glucose, Bld 88  70 - 99 mg/dL   BUN 6  6 - 23 mg/dL   Creatinine, Ser 7.82  0.50 - 1.10 mg/dL   Calcium 9.6  8.4 - 95.6 mg/dL   GFR calc non Af Amer >90  >90 mL/min   GFR calc Af Amer >90  >90 mL/min    Comment:            The eGFR has been calculated     using the CKD EPI equation.     This calculation has not been     validated in all clinical     situations.     eGFR's persistently     <90 mL/min signify     possible Chronic Kidney Disease.  PREGNANCY, URINE     Status: None   Collection Time    06/02/12 12:47 AM      Result Value Range   Preg Test, Ur NEGATIVE  NEGATIVE   Comment:  THE SENSITIVITY OF THIS     METHODOLOGY IS >20 mIU/mL.    Radiological Exams on Admission: Dg Forearm Right  06/01/2012   *RADIOLOGY REPORT*  Clinical Data: Abscess right anterior lateral wrist region  RIGHT FOREARM - 2 VIEW  Comparison: None  Findings: There is diffuse soft tissue swelling along the dorsum of the distal forearm.aa  There is no acute fracture or subluxation identified.  No focal bony erosions identified.  There is no radiopaque foreign bodies or soft tissue calcifications.  IMPRESSION:  1.  Dorsal soft tissue swelling.   Original Report Authenticated By: Signa Kell, M.D.     Assessment/Plan Principal Problem:   Cellulitis and abscess Active Problems:   Heroin abuse   Tobacco use disorder    1.  Cellulitis and Abscess-IV Clindamycin ordered after she appeared to have an allergic reaction to the Vancomycin, so the Vancomycin was discontinued.  Ortho/Hand was consulted by th eEDP  2.   Heroin Abuse-  Needs Substance Abuse counseling.    3.   Tobacco Use Disorder-  Nicotine patch daily.        Code Status:    FULL CODE Family Communication:  No Family at Bedside Disposition Plan:   Return to Home on Discharge    Time spent: 52 Minutes  Ron Parker [facility] Pager [phone]  If 7PM-7AM, please contact night-coverage [email or web address] Password [email or web address] 06/02/2012, 8:30 AM

## 2012-06-02 NOTE — ED Notes (Signed)
[facility] here for transport. 

## 2012-06-02 NOTE — Anesthesia Preprocedure Evaluation (Addendum)
Anesthesia Evaluation  Patient identified by MRN, date of birth, ID band Patient awake    Reviewed: Allergy & Precautions, H&P , NPO status , Patient's Chart, lab work & pertinent test results  Airway Mallampati: I TM Distance: >3 FB Neck ROM: Full    Dental  (+) Dental Advisory Given   Pulmonary          Cardiovascular     Neuro/Psych    GI/Hepatic   Endo/Other    Renal/GU      Musculoskeletal   Abdominal   Peds  Hematology   Anesthesia Other Findings   Reproductive/Obstetrics                          Anesthesia Physical Anesthesia Plan  ASA: II and emergent  Anesthesia Plan: General   Post-op Pain Management:    Induction: Intravenous  Airway Management Planned: Oral ETT  Additional Equipment:   Intra-op Plan:   Post-operative Plan: Extubation in OR  Informed Consent: I have reviewed the patients History and Physical, chart, labs and discussed the procedure including the risks, benefits and alternatives for the proposed anesthesia with the patient or authorized representative who has indicated his/her understanding and acceptance.     Plan Discussed with: CRNA and Surgeon  Anesthesia Plan Comments:         Anesthesia Quick Evaluation

## 2012-06-03 MED ORDER — CLINDAMYCIN HCL 300 MG PO CAPS
300.0000 mg | ORAL_CAPSULE | Freq: Four times a day (QID) | ORAL | Status: DC
  Administered 2012-06-03 – 2012-06-04 (×4): 300 mg via ORAL
  Filled 2012-06-03 (×8): qty 1

## 2012-06-03 NOTE — Progress Notes (Signed)
[facility] PROGRESS NOTE  Janice Harrison JWJ:[id number] DOB: 1985/02/28 DOA: 06/01/2012 PCP: No primary provider on file.  Assessment/Plan: 1. Right wrist cellulitis/abscess: Patient is status post drainage of the right wrist cellulitis/abscess. Cultures are thus far unremarkable. Has been continued on IV clindamycin. Virtue by mouth clindamycin. Patient remains afebrile with no leukocytosis. 2. polysubstance abuse: As per H&P, the patient has a history of heroin abuse. She will benefit greatly from a substance abuse program. 3. DVT prophylaxis: Continue Lovenox as tolerated 4. FEN: For the time being, we'll keep the patient n.p.o. pending recommendations from surgery.  Code Status: Full Family Communication: Family in room (indicate person spoken with, relationship, and if by phone, the number) Disposition Plan: Pending   Consultants:  Orthopedic, hand surgery  Procedures:  I&D of abscess on 06/02/2012  Antibiotics: Vancomycin 06/02/12>>>06/02/12 Levaquin 06/02/12>>>06/02/12 Clindamycin 06/02/12>>>  HPI/Subjective: Patient is without complaints this morning  Objective: Filed Vitals:   06/02/12 1845 06/02/12 1904 06/02/12 2308 06/03/12 0705  BP: 110/72 116/69 101/58 99/53  Pulse: 65 82 77 66  Temp:  97.3 F (36.3 C) 98.6 F (37 C) 97.7 F (36.5 C)  TempSrc:   Oral Axillary  Resp: 10 22 18 18   Height:      Weight:      SpO2: 100% 100% 99% 99%    Intake/Output Summary (Last 24 hours) at 06/03/12 0905 Last data filed at 06/02/12 2000  Gross per 24 hour  Intake    500 ml  Output    420 ml  Net     80 ml   Filed Weights   06/01/12 2120  Weight: 68.04 kg (150 lb)    Exam:   General:  Patient's awake and apparent stress  Cardiovascular: Regular S1-S2  Respiratory: Normal respiratory effort, no crackles no wheezing  Abdomen: Soft positive bowel sounds  Musculoskeletal: No clubbing or cyanosis, postsurgical dressings dry clean dry and intact over the  right forearm   Data Reviewed: Basic Metabolic Panel:  Recent Labs Lab 06/02/12 0040 06/02/12 0845  NA 141 139  K 3.9 4.3  CL 104 105  CO2 27 23  GLUCOSE 88 100*  BUN 6 7  CREATININE 0.60 0.59  CALCIUM 9.6 9.0   Liver Function Tests: No results found for this basename: AST, ALT, ALKPHOS, BILITOT, PROT, ALBUMIN,  in the last 168 hours No results found for this basename: LIPASE, AMYLASE,  in the last 168 hours No results found for this basename: AMMONIA,  in the last 168 hours CBC:  Recent Labs Lab 06/02/12 0040 06/02/12 0845  WBC 9.5 7.2  NEUTROABS 6.1  --   HGB 12.7 12.1  HCT 37.7 36.5  MCV 92.2 90.3  PLT 227 212   Cardiac Enzymes: No results found for this basename: CKTOTAL, CKMB, CKMBINDEX, TROPONINI,  in the last 168 hours BNP (last 3 results) No results found for this basename: PROBNP,  in the last 8760 hours CBG: No results found for this basename: GLUCAP,  in the last 168 hours  Recent Results (from the past 240 hour(s))  MRSA PCR SCREENING     Status: None   Collection Time    06/02/12 10:08 AM      Result Value Range Status   MRSA by PCR NEGATIVE  NEGATIVE Final   Comment:            The GeneXpert MRSA Assay (FDA     approved for NASAL specimens     only), is one component of a  comprehensive MRSA colonization     surveillance program. It is not     intended to diagnose MRSA     infection nor to guide or     monitor treatment for     MRSA infections.  CULTURE, ROUTINE-ABSCESS     Status: None   Collection Time    06/02/12  5:40 PM      Result Value Range Status   Specimen Description ABSCESS RIGHT FOREARM   Final   Special Requests NONE   Final   Gram Stain PENDING   Incomplete   Culture NO GROWTH   Final   Report Status PENDING   Incomplete     Studies: Dg Forearm Right  06/01/2012   *RADIOLOGY REPORT*  Clinical Data: Abscess right anterior lateral wrist region  RIGHT FOREARM - 2 VIEW  Comparison: None  Findings: There is diffuse soft  tissue swelling along the dorsum of the distal forearm.aa  There is no acute fracture or subluxation identified.  No focal bony erosions identified.  There is no radiopaque foreign bodies or soft tissue calcifications.  IMPRESSION:  1.  Dorsal soft tissue swelling.   Original Report Authenticated By: Signa Kell, M.D.   Mr Wrist Right W Wo Contrast  06/02/2012   *RADIOLOGY REPORT*  Clinical Data: Abscess and cellulitis in the right forearm and wrist.  MRI OF THE RIGHT WRIST WITHOUT AND WITH CONTRAST  Technique:  Multiplanar, multisequence MR imaging was performed both before and after administration of intravenous contrast.  Contrast: 15mL MULTIHANCE GADOBENATE DIMEGLUMINE 529 MG/ML IV SOLN  Comparison: Radiographs dated 06/01/2012  Findings: There is extensive cellulitis primarily of the dorsum of the hand and of the dorsum of the forearm.  There are multiple pockets of pus extensively on the dorsum of the hand and just superficial to the extensor tendons.  Many of these areas of pus are not well defined.  There is a prominent 9 mm pus pocket on the volar aspect of the wrist dorsal to the triquetrum and in the subcutaneous fat.  There is another pus pocket dorsal to the distal radius 13 mm proximal to the radiocarpal joint.  This pocket measures 8 x 6 x 18 mm.  After contrast administration there is intense enhancement of the subcutaneous fat of the dorsum of the hand and extending almost circumferentially around the distal forearm consistent with cellulitis.  There is enhancement around the extensor tendons on the dorsum of the hand.  No osseous abnormality.  No abnormal enhancement of radiocarpal joint or midcarpal joint or the carpal metacarpal joints.  IMPRESSION:   Extensive cellulitis with multiple areas of pus in the dorsum of the hand and distal forearm as described.  The cellulitis extends around the extensor tendons on the dorsum of the hand.   Original Report Authenticated By: Francene Boyers, M.D.    Mr Forearm Right Wo/w Cm  06/02/2012   *RADIOLOGY REPORT*  Clinical Data: Cellulitis and abscess is of the forearm and hand.  MRI OF THE RIGHT FOREARM WITHOUT AND WITH CONTRAST  Technique:  Multiplanar, multisequence MR imaging was performed both before and after administration of intravenous contrast.  Contrast: 15mL MULTIHANCE GADOBENATE DIMEGLUMINE 529 MG/ML IV SOLN  Comparison: None.  Findings: There is extensive cellulitis on the dorsum of the forearm extending 16 cm proximal to the radiocarpal joint.  There is no osseous or muscle involvement.  There is a pus pocket just deep to the vein on the dorsum of the distal forearm just proximal to  the radiocarpal joint.  There are also pus collections extending just superficial to the fascia of the dorsal muscles of the forearm.  This does extend 16 cm proximal to the radiocarpal joint. There is diffuse extensive cellulitis in the subcutaneous fat of the forearm.  IMPRESSION: Extensive cellulitis of the dorsum of the forearm with pus just superficial to the fascia of the extensor muscles extending from the wrist joint 16 cm proximally.  Focal abscess just dorsal to the distal radius.   Original Report Authenticated By: Francene Boyers, M.D.    Scheduled Meds: . clindamycin  300 mg Oral Q6H  . naproxen  500 mg Oral BID WC  . nicotine  14 mg Transdermal Daily   Continuous Infusions: . sodium chloride 125 mL/hr at 06/03/12 0704    Principal Problem:   Cellulitis and abscess Active Problems:   Heroin abuse   Tobacco use disorder    Time spent: 25 minutes    Janice Harrison K  [facility] Pager [phone]. If 7PM-7AM, please contact night-coverage at [email or web address], password [facility] 06/03/2012, 9:05 AM  LOS: 2 days

## 2012-06-03 NOTE — Progress Notes (Signed)
Utilization Review Completed.   Manali Mcelmurry, RN, BSN Nurse Case Manager  [phone]  

## 2012-06-03 NOTE — Progress Notes (Addendum)
Subjective: POD1 right dorsal wrist / forearm I&D Presently sleeping and not awakened   Objective: Vital signs in last 24 hours: Temp:  [97.3 F (36.3 C)-98.6 F (37 C)] 98.6 F (37 C) (05/17 2308) Pulse Rate:  [65-122] 77 (05/17 2308) Resp:  [10-22] 18 (05/17 2308) BP: (101-119)/(58-80) 101/58 mmHg (05/17 2308) SpO2:  [97 %-100 %] 99 % (05/17 2308)  Intake/Output from previous day: 05/17 0701 - 05/18 0700 In: 500 [I.V.:500] Out: 420 [Urine:400; Blood:20] Intake/Output this shift: Total I/O In: -  Out: 400 [Urine:400]   Recent Labs  06/02/12 0040 06/02/12 0845  HGB 12.7 12.1    Recent Labs  06/02/12 0040 06/02/12 0845  WBC 9.5 7.2  RBC 4.09 4.04  HCT 37.7 36.5  PLT 227 212    Recent Labs  06/02/12 0040 06/02/12 0845  NA 141 139  K 3.9 4.3  CL 104 105  CO2 27 23  BUN 6 7  CREATININE 0.60 0.59  GLUCOSE 88 100*  CALCIUM 9.6 9.0   No results found for this basename: LABPT, INR,  in the last 72 hours  Right UE splint intact, fingers mildly swollen, warm and pink  Assessment/Plan: Cxs-P;  Antibiotic management per medical service. Will plan to take down dressing, pull drains tomorrow & likely clear for d/c pending medical arrangments for ongoing antibiotic tx    Ky Moskowitz A. 06/03/2012, 6:30 AM

## 2012-06-04 ENCOUNTER — Encounter (HOSPITAL_COMMUNITY): Payer: Self-pay | Admitting: Orthopedic Surgery

## 2012-06-04 DIAGNOSIS — F1110 Opioid abuse, uncomplicated: Secondary | ICD-10-CM

## 2012-06-04 DIAGNOSIS — L0291 Cutaneous abscess, unspecified: Secondary | ICD-10-CM

## 2012-06-04 DIAGNOSIS — L0390 Cellulitis, unspecified: Secondary | ICD-10-CM

## 2012-06-04 LAB — CBC WITH DIFFERENTIAL/PLATELET
Basophils Absolute: 0 10*3/uL (ref 0.0–0.1)
Basophils Relative: 0 % (ref 0–1)
Eosinophils Relative: 3 % (ref 0–5)
HCT: 37.9 % (ref 36.0–46.0)
Hemoglobin: 12.5 g/dL (ref 12.0–15.0)
MCH: 30 pg (ref 26.0–34.0)
MCHC: 33 g/dL (ref 30.0–36.0)
MCV: 90.9 fL (ref 78.0–100.0)
Monocytes Absolute: 0.6 10*3/uL (ref 0.1–1.0)
Monocytes Relative: 10 % (ref 3–12)
RDW: 13.7 % (ref 11.5–15.5)

## 2012-06-04 MED ORDER — CLINDAMYCIN HCL 300 MG PO CAPS: 300.0000 mg | ORAL_CAPSULE | Freq: Four times a day (QID) | ORAL | Status: DC

## 2012-06-04 MED ORDER — HYDROCODONE-ACETAMINOPHEN 10-500 MG PO TABS: 1.0000 | ORAL_TABLET | Freq: Four times a day (QID) | ORAL | Status: DC | PRN

## 2012-06-04 NOTE — Progress Notes (Signed)
Patient alert and oriented x4.  Pt and patient's mother given discharge instructions.  Denies any questions or concerns.  Discussed surgical site care, signs and symptoms to be aware of and provided patient with materials for dressing changes.  IV access removed, cannula intact; no redness, bruising, drainage to site.  Pt given prescriptions and informed that she will need to go by Dr. Carollee Massed office for work excuse note.  Patient's belongings gathered with patient.  Pt to be discharged with wheelchair to private vehicle to home.

## 2012-06-04 NOTE — Discharge Summary (Signed)
Physician Discharge Summary  Janice Harrison ZOX:[id number] DOB: January 21, 1985 DOA: 06/01/2012  PCP: No primary provider on file.  Admit date: 06/01/2012 Discharge date: 06/04/2012  Time spent: 25 minutes  Recommendations for Outpatient Follow-up:  1. Establish with her primary care provider within 2 weeks 2. Follow up with Dr. Mack Hook as scheduled  Discharge Diagnoses:  Principal Problem:   Cellulitis and abscess Active Problems:   Heroin abuse   Tobacco use disorder   Discharge Condition: Improved  Diet recommendation: Regular  Filed Weights   06/01/12 2120  Weight: 68.04 kg (150 lb)    History of present illness:  Janice Harrison is a 27 y.o. female with a history of Heroin Abuse who presents to the ED with complaints of increased swelling redness and pain of her right Forearm and Wrist over the past week. She denies having fever or chills. She tried to drain the nodule herself but only yielded a small amount of pus. She reports that she last used heroin 2 weeks ago. She was evaluated in the ED at [facility] and placed on IV Vancomycin and IV Levaquin and transferred for admission. The EDP contacted Ortho/Hand surgery for consultation (Dr. Mack Hook).    Hospital Course:  Upon admission to the inpatient service, orthopedic service was consulted. The patient did undergo right distal forearm incision and drainage of the abscess. Regarding antibiotics, she did get a dose of vancomycin as well as Levaquin on initial presentation. Subsequent to that, the patient was continued on clindamycin IV, start date 06/02/2012. The patient was transitioned to oral clindamycin on 06/04/2012. She should continue oral clindamycin for 10 days additional.  Procedures:  Incision/drainage of right distal forearm abscess on 06/03/2012  Consultations:  Dr. Mack Hook  Discharge Exam: Filed Vitals:   06/02/12 2308 06/03/12 0705 06/03/12 2217 06/04/12 0636  BP: 101/58 99/53 109/58 105/51   Pulse: 77 66 69 71  Temp: 98.6 F (37 C) 97.7 F (36.5 C) 98 F (36.7 C) 97 F (36.1 C)  TempSrc: Oral Axillary Oral Oral  Resp: 18 18 18 16   Height:      Weight:      SpO2: 99% 99% 100% 100%    General: Patient's awake no apparent stress Cardiovascular: Regular S1-S2 Respiratory: Normal respiratory effort, crackles no wheeze  Discharge Instructions     Medication List    ASK your doctor about these medications       B-COMPLEX PO  Take 1 tablet by mouth daily.     ibuprofen 200 MG tablet  Commonly known as:  ADVIL,MOTRIN  Take 600 mg by mouth 2 (two) times daily as needed for pain or headache.     VITAMIN B 12 PO  Take 2 tablets by mouth daily.       Allergies  Allergen Reactions  . Augmentin (Amoxicillin-Pot Clavulanate)   . Vancomycin     Itching      The results of significant diagnostics from this hospitalization (including imaging, microbiology, ancillary and laboratory) are listed below for reference.    Significant Diagnostic Studies: Dg Forearm Right  06/01/2012   *RADIOLOGY REPORT*  Clinical Data: Abscess right anterior lateral wrist region  RIGHT FOREARM - 2 VIEW  Comparison: None  Findings: There is diffuse soft tissue swelling along the dorsum of the distal forearm.aa  There is no acute fracture or subluxation identified.  No focal bony erosions identified.  There is no radiopaque foreign bodies or soft tissue calcifications.  IMPRESSION:  1.  Dorsal soft tissue  swelling.   Original Report Authenticated By: Signa Kell, M.D.   Mr Wrist Right W Wo Contrast  06/02/2012   *RADIOLOGY REPORT*  Clinical Data: Abscess and cellulitis in the right forearm and wrist.  MRI OF THE RIGHT WRIST WITHOUT AND WITH CONTRAST  Technique:  Multiplanar, multisequence MR imaging was performed both before and after administration of intravenous contrast.  Contrast: 15mL MULTIHANCE GADOBENATE DIMEGLUMINE 529 MG/ML IV SOLN  Comparison: Radiographs dated 06/01/2012   Findings: There is extensive cellulitis primarily of the dorsum of the hand and of the dorsum of the forearm.  There are multiple pockets of pus extensively on the dorsum of the hand and just superficial to the extensor tendons.  Many of these areas of pus are not well defined.  There is a prominent 9 mm pus pocket on the volar aspect of the wrist dorsal to the triquetrum and in the subcutaneous fat.  There is another pus pocket dorsal to the distal radius 13 mm proximal to the radiocarpal joint.  This pocket measures 8 x 6 x 18 mm.  After contrast administration there is intense enhancement of the subcutaneous fat of the dorsum of the hand and extending almost circumferentially around the distal forearm consistent with cellulitis.  There is enhancement around the extensor tendons on the dorsum of the hand.  No osseous abnormality.  No abnormal enhancement of radiocarpal joint or midcarpal joint or the carpal metacarpal joints.  IMPRESSION:   Extensive cellulitis with multiple areas of pus in the dorsum of the hand and distal forearm as described.  The cellulitis extends around the extensor tendons on the dorsum of the hand.   Original Report Authenticated By: Francene Boyers, M.D.   Mr Forearm Right Wo/w Cm  06/02/2012   *RADIOLOGY REPORT*  Clinical Data: Cellulitis and abscess is of the forearm and hand.  MRI OF THE RIGHT FOREARM WITHOUT AND WITH CONTRAST  Technique:  Multiplanar, multisequence MR imaging was performed both before and after administration of intravenous contrast.  Contrast: 15mL MULTIHANCE GADOBENATE DIMEGLUMINE 529 MG/ML IV SOLN  Comparison: None.  Findings: There is extensive cellulitis on the dorsum of the forearm extending 16 cm proximal to the radiocarpal joint.  There is no osseous or muscle involvement.  There is a pus pocket just deep to the vein on the dorsum of the distal forearm just proximal to the radiocarpal joint.  There are also pus collections extending just superficial to the  fascia of the dorsal muscles of the forearm.  This does extend 16 cm proximal to the radiocarpal joint. There is diffuse extensive cellulitis in the subcutaneous fat of the forearm.  IMPRESSION: Extensive cellulitis of the dorsum of the forearm with pus just superficial to the fascia of the extensor muscles extending from the wrist joint 16 cm proximally.  Focal abscess just dorsal to the distal radius.   Original Report Authenticated By: Francene Boyers, M.D.    Microbiology: Recent Results (from the past 240 hour(s))  MRSA PCR SCREENING     Status: None   Collection Time    06/02/12 10:08 AM      Result Value Range Status   MRSA by PCR NEGATIVE  NEGATIVE Final   Comment:            The GeneXpert MRSA Assay (FDA     approved for NASAL specimens     only), is one component of a     comprehensive MRSA colonization     surveillance program. It is not  intended to diagnose MRSA     infection nor to guide or     monitor treatment for     MRSA infections.  CULTURE, ROUTINE-ABSCESS     Status: None   Collection Time    06/02/12  5:40 PM      Result Value Range Status   Specimen Description ABSCESS RIGHT FOREARM   Final   Special Requests NONE   Final   Gram Stain     Final   Value: ABUNDANT WBC PRESENT, PREDOMINANTLY PMN     NO SQUAMOUS EPITHELIAL CELLS SEEN     MODERATE GRAM POSITIVE COCCI IN PAIRS     RARE GRAM POSITIVE RODS   Culture MODERATE STREPTOCOCCUS SPECIES   Final   Report Status PENDING   Incomplete  ANAEROBIC CULTURE     Status: None   Collection Time    06/02/12  5:40 PM      Result Value Range Status   Specimen Description ABSCESS RIGHT FOREARM   Final   Special Requests NONE   Final   Gram Stain     Final   Value: ABUNDANT WBC PRESENT, PREDOMINANTLY PMN     NO SQUAMOUS EPITHELIAL CELLS SEEN     MODERATE GRAM POSITIVE COCCI IN PAIRS     RARE GRAM POSITIVE RODS   Culture     Final   Value: NO ANAEROBES ISOLATED; CULTURE IN PROGRESS FOR 5 DAYS   Report Status  PENDING   Incomplete     Labs: Basic Metabolic Panel:  Recent Labs Lab 06/02/12 0040 06/02/12 0845  NA 141 139  K 3.9 4.3  CL 104 105  CO2 27 23  GLUCOSE 88 100*  BUN 6 7  CREATININE 0.60 0.59  CALCIUM 9.6 9.0   Liver Function Tests: No results found for this basename: AST, ALT, ALKPHOS, BILITOT, PROT, ALBUMIN,  in the last 168 hours No results found for this basename: LIPASE, AMYLASE,  in the last 168 hours No results found for this basename: AMMONIA,  in the last 168 hours CBC:  Recent Labs Lab 06/02/12 0040 06/02/12 0845 06/04/12 0550  WBC 9.5 7.2 5.8  NEUTROABS 6.1  --  3.4  HGB 12.7 12.1 12.5  HCT 37.7 36.5 37.9  MCV 92.2 90.3 90.9  PLT 227 212 224   Cardiac Enzymes: No results found for this basename: CKTOTAL, CKMB, CKMBINDEX, TROPONINI,  in the last 168 hours BNP: BNP (last 3 results) No results found for this basename: PROBNP,  in the last 8760 hours CBG: No results found for this basename: GLUCAP,  in the last 168 hours     Signed:  Rylynn Schoneman K  [facility] 06/04/2012, 9:57 AM

## 2012-06-04 NOTE — Progress Notes (Signed)
Subjective: POD2 right dorsal wrist / forearm I&D C/o some pain at incisions  Objective: Vital signs in last 24 hours: Temp:  [97 F (36.1 C)-98 F (36.7 C)] 97 F (36.1 C) (05/19 0636) Pulse Rate:  [69-71] 71 (05/19 0636) Resp:  [16-18] 16 (05/19 0636) BP: (105-109)/(51-58) 105/51 mmHg (05/19 0636) SpO2:  [100 %] 100 % (05/19 0636)  Intake/Output from previous day: 05/18 0701 - 05/19 0700 In: 5589.6 [I.V.:5589.6] Out: -  Intake/Output this shift:     Recent Labs  06/02/12 0040 06/02/12 0845 06/04/12 0550  HGB 12.7 12.1 12.5    Recent Labs  06/02/12 0845 06/04/12 0550  WBC 7.2 5.8  RBC 4.04 4.17  HCT 36.5 37.9  PLT 212 224    Recent Labs  06/02/12 0040 06/02/12 0845  NA 141 139  K 3.9 4.3  CL 104 105  CO2 27 23  BUN 6 7  CREATININE 0.60 0.59  GLUCOSE 88 100*  CALCIUM 9.6 9.0   No results found for this basename: LABPT, INR,  in the last 72 hours  R distal UE mildly swollen, drains pulled, no expressible drainage Redness much improved, localized swelling at major abscess resolved.  Assessment/Plan: Cxs-Strep , but gram stain also with GPR Surgical role appears presently accomplished. May d/c once appropriate antibiotic type, method of delivery, and duration determined by medical service Patient will need outpatient followup with PCP or acceptable substitute in a couple of days to assume care, determine if treatment is still effective and condition is resolving, and to determine when to halt ongoing medical management.  If PCP not comfortable removing sutures in 10 days, patient may f/u with my office at that time for suture removal.  Tanyia Grabbe A. 06/04/2012, 11:38 AM

## 2012-06-05 LAB — CULTURE, ROUTINE-ABSCESS

## 2012-06-07 LAB — ANAEROBIC CULTURE

## 2012-09-28 ENCOUNTER — Emergency Department (HOSPITAL_BASED_OUTPATIENT_CLINIC_OR_DEPARTMENT_OTHER)
Admission: EM | Admit: 2012-09-28 | Discharge: 2012-09-28 | Disposition: A | Discharge status: Home / Self Care | Payer: BC Managed Care – PPO | Attending: Emergency Medicine | Admitting: Emergency Medicine

## 2012-09-28 ENCOUNTER — Emergency Department (HOSPITAL_BASED_OUTPATIENT_CLINIC_OR_DEPARTMENT_OTHER): Payer: BC Managed Care – PPO

## 2012-09-28 ENCOUNTER — Encounter (HOSPITAL_BASED_OUTPATIENT_CLINIC_OR_DEPARTMENT_OTHER): Payer: Self-pay | Admitting: *Deleted

## 2012-09-28 DIAGNOSIS — F17200 Nicotine dependence, unspecified, uncomplicated: Secondary | ICD-10-CM | POA: Insufficient documentation

## 2012-09-28 DIAGNOSIS — Z792 Long term (current) use of antibiotics: Secondary | ICD-10-CM | POA: Insufficient documentation

## 2012-09-28 DIAGNOSIS — J40 Bronchitis, not specified as acute or chronic: Secondary | ICD-10-CM

## 2012-09-28 DIAGNOSIS — Z79899 Other long term (current) drug therapy: Secondary | ICD-10-CM | POA: Insufficient documentation

## 2012-09-28 DIAGNOSIS — J209 Acute bronchitis, unspecified: Secondary | ICD-10-CM | POA: Insufficient documentation

## 2012-09-28 MED ORDER — ALBUTEROL SULFATE HFA 108 (90 BASE) MCG/ACT IN AERS: 1.0000 | INHALATION_SPRAY | Freq: Four times a day (QID) | RESPIRATORY_TRACT | Status: DC | PRN

## 2012-09-28 MED ORDER — AZITHROMYCIN 250 MG PO TABS: 250.0000 mg | ORAL_TABLET | Freq: Every day | ORAL | Status: DC

## 2012-09-28 NOTE — ED Provider Notes (Signed)
CSN: [id number]     Arrival date & time 09/28/12  1209 History   First MD Initiated Contact with Patient 09/28/12 1234     Chief Complaint  Patient presents with  . Shortness of Breath   (Consider location/radiation/quality/duration/timing/severity/associated sxs/prior Treatment) Patient is a 27 y.o. female presenting with cough. The history is provided by the patient.  Cough Cough characteristics:  Productive Severity:  Moderate Onset quality:  Gradual Duration:  1 week Timing:  Constant Progression:  Worsening Relieved by:  Nothing Worsened by:  Nothing tried Ineffective treatments:  None tried Associated symptoms: shortness of breath   Associated symptoms: no chest pain     History reviewed. No pertinent past medical history. Past Surgical History  Procedure Laterality Date  . I&d extremity Right 06/02/2012    Procedure: IRRIGATION AND DEBRIDEMENT EXTREMITY;  Surgeon: Jodi Marble, MD;  Location: [facility] OR;  Service: Orthopedics;  Laterality: Right;   No family history on file. History  Substance Use Topics  . Smoking status: Current Every Day Smoker -- 0.50 packs/day    Types: Cigarettes  . Smokeless tobacco: Not on file  . Alcohol Use: No   OB History   Grav Para Term Preterm Abortions TAB SAB Ect Mult Living                 Review of Systems  Respiratory: Positive for cough and shortness of breath.   Cardiovascular: Negative for chest pain.  All other systems reviewed and are negative.    Allergies  Augmentin and Vancomycin  Home Medications   Current Outpatient Rx  Name  Route  Sig  Dispense  Refill  . B Complex-Biotin-FA (B-COMPLEX PO)   Oral   Take 1 tablet by mouth daily.         . clindamycin (CLEOCIN) 300 MG capsule   Oral   Take 1 capsule (300 mg total) by mouth every 6 (six) hours.   40 capsule   0   . Cyanocobalamin (VITAMIN B 12 PO)   Oral   Take 2 tablets by mouth daily.         Marland Kitchen HYDROcodone-acetaminophen (LORTAB) 10-500 MG  per tablet   Oral   Take 1 tablet by mouth every 6 (six) hours as needed for pain.   10 tablet   0   . ibuprofen (ADVIL,MOTRIN) 200 MG tablet   Oral   Take 600 mg by mouth 2 (two) times daily as needed for pain or headache.          BP 131/83  Pulse 77  Temp(Src) 97.6 F (36.4 C) (Oral)  Resp 22  SpO2 100% Physical Exam  Nursing note and vitals reviewed. Constitutional: She appears well-developed and well-nourished.  HENT:  Head: Normocephalic.  Right Ear: External ear normal.  Left Ear: External ear normal.  Nose: Nose normal.  Mouth/Throat: Oropharynx is clear and moist.  Eyes: Pupils are equal, round, and reactive to light.  Neck: Normal range of motion.  Cardiovascular: Normal rate and normal heart sounds.   Pulmonary/Chest: Effort normal and breath sounds normal.  Abdominal: Soft. Bowel sounds are normal.  Neurological: She is alert.  Skin: Skin is warm.    ED Course  Procedures (including critical care time) Labs Review Labs Reviewed - No data to display Imaging Review Dg Chest 2 View  09/28/2012   *RADIOLOGY REPORT*  Clinical Data: Shortness of breath  CHEST - 2 VIEW  Comparison: None.  Findings: Lungs clear.  Heart size and pulmonary  vascularity are normal.  No adenopathy.  There is mild upper thoracic dextroscoliosis.  IMPRESSION: No edema or consolidation.   Original Report Authenticated By: Bretta Bang, M.D.    Date: 09/28/2012  Rate: 73  Rhythm: normal sinus rhythm  QRS Axis: normal  Intervals: normal  ST/T Wave abnormalities: nonspecific T wave changes  Conduction Disutrbances:none  Narrative Interpretation:   Old EKG Reviewed: none available  MDM   1. Bronchitis    Pt given rx for albuterol and zithromax  Elson Areas, PA-C 09/28/12 1437  Lonia Skinner [place], PA-C 09/28/12 1441  Lonia Skinner [place], [place] 09/28/12 1540

## 2012-09-28 NOTE — ED Notes (Signed)
Cough with greenish brown sputum. Sob. States she thinks she has pneumonia.

## 2012-09-29 NOTE — ED Provider Notes (Signed)
Medical screening examination/treatment/procedure(s) were performed by non-physician practitioner and as supervising physician I was immediately available for consultation/collaboration.   Junius Argyle, MD 09/29/12 1212

## 2012-12-24 ENCOUNTER — Emergency Department (HOSPITAL_COMMUNITY)
Admission: EM | Admit: 2012-12-24 | Discharge: 2012-12-24 | Disposition: A | Discharge status: Home / Self Care | Payer: BC Managed Care – PPO | Attending: Emergency Medicine | Admitting: Emergency Medicine

## 2012-12-24 ENCOUNTER — Encounter (HOSPITAL_COMMUNITY): Payer: Self-pay | Admitting: Emergency Medicine

## 2012-12-24 DIAGNOSIS — R6889 Other general symptoms and signs: Secondary | ICD-10-CM | POA: Insufficient documentation

## 2012-12-24 DIAGNOSIS — F1110 Opioid abuse, uncomplicated: Secondary | ICD-10-CM | POA: Insufficient documentation

## 2012-12-24 DIAGNOSIS — Z3202 Encounter for pregnancy test, result negative: Secondary | ICD-10-CM | POA: Insufficient documentation

## 2012-12-24 DIAGNOSIS — Z79899 Other long term (current) drug therapy: Secondary | ICD-10-CM | POA: Insufficient documentation

## 2012-12-24 DIAGNOSIS — F17200 Nicotine dependence, unspecified, uncomplicated: Secondary | ICD-10-CM | POA: Insufficient documentation

## 2012-12-24 DIAGNOSIS — F1910 Other psychoactive substance abuse, uncomplicated: Secondary | ICD-10-CM

## 2012-12-24 DIAGNOSIS — R110 Nausea: Secondary | ICD-10-CM | POA: Insufficient documentation

## 2012-12-24 HISTORY — DX: Other psychoactive substance dependence, uncomplicated: F19.20

## 2012-12-24 LAB — RAPID URINE DRUG SCREEN, HOSP PERFORMED
Barbiturates: NOT DETECTED
Benzodiazepines: NOT DETECTED
Cocaine: NOT DETECTED
Tetrahydrocannabinol: POSITIVE — AB

## 2012-12-24 LAB — CBC WITH DIFFERENTIAL/PLATELET
Basophils Relative: 0 % (ref 0–1)
Eosinophils Absolute: 0.1 10*3/uL (ref 0.0–0.7)
HCT: 38.2 % (ref 36.0–46.0)
Hemoglobin: 12.6 g/dL (ref 12.0–15.0)
Lymphs Abs: 1.8 10*3/uL (ref 0.7–4.0)
MCH: 29.2 pg (ref 26.0–34.0)
MCHC: 33 g/dL (ref 30.0–36.0)
Monocytes Absolute: 0.4 10*3/uL (ref 0.1–1.0)
Monocytes Relative: 5 % (ref 3–12)

## 2012-12-24 LAB — BASIC METABOLIC PANEL
BUN: 8 mg/dL (ref 6–23)
Chloride: 103 mEq/L (ref 96–112)
Creatinine, Ser: 0.61 mg/dL (ref 0.50–1.10)
GFR calc Af Amer: >90 mL/min (ref >90)
GFR calc non Af Amer: >90 mL/min (ref >90)
Glucose, Bld: 88 mg/dL (ref 70–99)

## 2012-12-24 LAB — URINALYSIS, ROUTINE W REFLEX MICROSCOPIC
Bilirubin Urine: NEGATIVE
Hgb urine dipstick: NEGATIVE
Ketones, ur: NEGATIVE mg/dL
Nitrite: NEGATIVE
pH: 6 (ref 5.0–8.0)

## 2012-12-24 LAB — ETHANOL: Alcohol, Ethyl (B): <11 mg/dL (ref 0–11)

## 2012-12-24 LAB — URINE MICROSCOPIC-ADD ON

## 2012-12-24 NOTE — ED Notes (Signed)
Patient is requesting detox from heroin. Patient states she last used 2 days ago.

## 2012-12-24 NOTE — Progress Notes (Signed)
[facility] provided pt with a [employer] Orange Card application, highlighting [facility].  °

## 2012-12-24 NOTE — BHH Counselor (Addendum)
1430 Pt ready for d/c. Pt's mother sts she is still concerned re: pt's heroin use as pt is shooting up into her neck. Writer discussed w/ pt and mother the importance of pt following up w/ referral list given and that hopefully a bed would become available soon. Pt tells Clinical research associate that she will go to [facility] appt on 12/10 and mother sts she will transport pt. Pt sts she thinks she can stay clean until that time. Pt sts, "I just want to go home and sleep". Writer tells pt and mother that if for any reason they become worried re: pt's health, they should call EMS or drive immediately to the closest ED.  Evette Cristal, [place] Assessment Counselor   Pt denies SI, HI and [facility]. Pt sts that outpatient treatment won't help her as she could go home after the sessions and use heroin as opposed to a residential facility where she couldn't use. Pt sts she called [facility] and they have no beds currently and asked she call back at 9:15 am Tues. Writer called and spoke w/ EDP Nanavati who is in agreement to d/c pt with pt follow up with list of referrals given.  Evette Cristal, [place] Assessment Counselor

## 2012-12-24 NOTE — ED Provider Notes (Signed)
CSN: [id number]     Arrival date & time 12/24/12  4098 History   First MD Initiated Contact with Patient 12/24/12 1024     Chief Complaint  Patient presents with  . Medical Clearance   (Consider location/radiation/quality/duration/timing/severity/associated sxs/prior Treatment) HPI Comments: 27 y/o woman no psych hx, + hx of heroine abuse, last use being 2 days ago comes in for detox. Pt has never tried detox before. States that she got into some legal trouble and wants to try and quit. She has some cold sweats and nausea. No diarrhea, body aches, emesis. Pt has used narcotic pain meds in the past, denies any alcohol abuse. Pt denies fevers, chills, chest pains, shortness of breath, headaches, abdominal pain, uti like symptoms.   The history is provided by the patient.    Past Medical History  Diagnosis Date  . Drug addiction    Past Surgical History  Procedure Laterality Date  . I&d extremity Right 06/02/2012    Procedure: IRRIGATION AND DEBRIDEMENT EXTREMITY;  Surgeon: Jodi Marble, MD;  Location: [facility] OR;  Service: Orthopedics;  Laterality: Right;   Family History  Problem Relation Age of Onset  . Hypertension Mother    History  Substance Use Topics  . Smoking status: Current Every Day Smoker -- 0.50 packs/day    Types: Cigarettes  . Smokeless tobacco: Never Used  . Alcohol Use: No   OB History   Grav Para Term Preterm Abortions TAB SAB Ect Mult Living                 Review of Systems  Constitutional: Negative for fever.  Respiratory: Negative for shortness of breath.   Cardiovascular: Negative for chest pain.  Gastrointestinal: Positive for nausea. Negative for vomiting and abdominal pain.  Genitourinary: Negative for dysuria.  Musculoskeletal: Negative for neck pain.  Neurological: Negative for headaches.    Allergies  Augmentin and Vancomycin  Home Medications   Current Outpatient Rx  Name  Route  Sig  Dispense  Refill  . DULoxetine (CYMBALTA) 60 MG  capsule   Oral   Take 60 mg by mouth daily.         Marland Kitchen ibuprofen (ADVIL,MOTRIN) 200 MG tablet   Oral   Take 600 mg by mouth 2 (two) times daily as needed for pain or headache.          BP 125/84  Pulse 95  Temp(Src) 98.2 F (36.8 C) (Oral)  Resp 20  SpO2 100% Physical Exam  Constitutional: She is oriented to person, place, and time. She appears well-developed and well-nourished.  HENT:  Head: Normocephalic and atraumatic.  Eyes: EOM are normal. Pupils are equal, round, and reactive to light.  Neck: Neck supple.  Cardiovascular: Normal rate, regular rhythm and normal heart sounds.   No murmur heard. Pulmonary/Chest: Effort normal. No respiratory distress.  Abdominal: Soft. She exhibits no distension. There is no tenderness. There is no rebound and no guarding.  Neurological: She is alert and oriented to person, place, and time.  Skin: Skin is warm and dry.    ED Course  Procedures (including critical care time) Labs Review Labs Reviewed  URINALYSIS, ROUTINE W REFLEX MICROSCOPIC - Abnormal; Notable for the following:    Leukocytes, UA TRACE (*)    All other components within normal limits  URINE RAPID DRUG SCREEN (HOSP PERFORMED) - Abnormal; Notable for the following:    Tetrahydrocannabinol POSITIVE (*)    All other components within normal limits  URINE MICROSCOPIC-ADD ON -  Abnormal; Notable for the following:    Squamous Epithelial / LPF FEW (*)    Bacteria, UA FEW (*)    All other components within normal limits  CBC WITH DIFFERENTIAL  BASIC METABOLIC PANEL  ETHANOL  POCT PREGNANCY, URINE   Imaging Review No results found.  EKG Interpretation   None       MDM  No diagnosis found.  Pt comes in for detox from heroine. Vitals are stable, last used was 2 days ago, she is relatively comfortable.  Spoke with Behavioral health team, and they have provided patient resources for detox.  Did labs per their request -and they are clear.  Pt is medically  cleared for her program.   Derwood Kaplan, MD 12/24/12 1353

## 2012-12-24 NOTE — ED Notes (Signed)
I have attempted x2 to get blood on patient, unsuccessful x2. [facility] phlebotomist notified, will attempt to draw.

## 2012-12-24 NOTE — BHH Counselor (Signed)
Writer met w/ pt re: EDP Nanavarti's request. Pt sts she wants detox for heroin. Writer explained that [facility] doesn't have protocol for opiate detox and that [facility] doesn't take pt for opiate detox. Pt sts she is still interested in residential detox/treatment. Writer called and scheduled screening interview at [facility] for Wed 12/10 at 8 am. Pt st s she will attend interview. Writer also gave pt info re: other residential and outpatient facilities. Writer spoke w/ EDP who will order labs for pt in order for her to be medically cleared which is requirement for [facility].   Referrals given are the following: [facility]  Intensive Outpatient Programs [facility]    [facility] [address]     [address] [place],  [place]     [place], [place] [phone]      [phone] Both a day and evening program   *Accepts MCD  [facility]  [facility] [address]     [address] [place], [place] [address] [phone]      [phone]  [facility]: [facility]    [facility] - Intensive Outpatient [address]     [address] [place], [place] [address]     [place], [place]  [phone] *Accepts MCD     [phone]  Residential Treatment Programs ASAP Residential Treatment    [facility] [address]     [address] [place], [place] [phone]      [phone] or [phone]  [facility]     The [address] (Several in [place]) [address] [place], [place] [address]    [address] [place] [address]     [place], [place] [phone]      [phone]  [facility]   [facility] ([facility] [address]     [address] [place], [place] [address]     [place], [place] [phone]      [phone] Admissions:  8am-3pm M-F  Self-Help/Support Groups [facility]   Narcotics Anonymous (NA) Variety of support groups    [facility] [phone] (call for more info)    [address]        [place] - 2 meetings at this location  [employer], [place] Assessment Counselor

## 2012-12-24 NOTE — ED Notes (Signed)
Spoke to page from act team and pt will be d/c to an outpt center. She is working with md nanavit about getting her paper work together

## 2014-01-02 IMAGING — CR DG CHEST 2V
2 series · 2 of 2 positions shown · non-contrast
Comparison: None.

CLINICAL DATA: Shortness of breath

CHEST - 2 VIEW

[w chest pa]
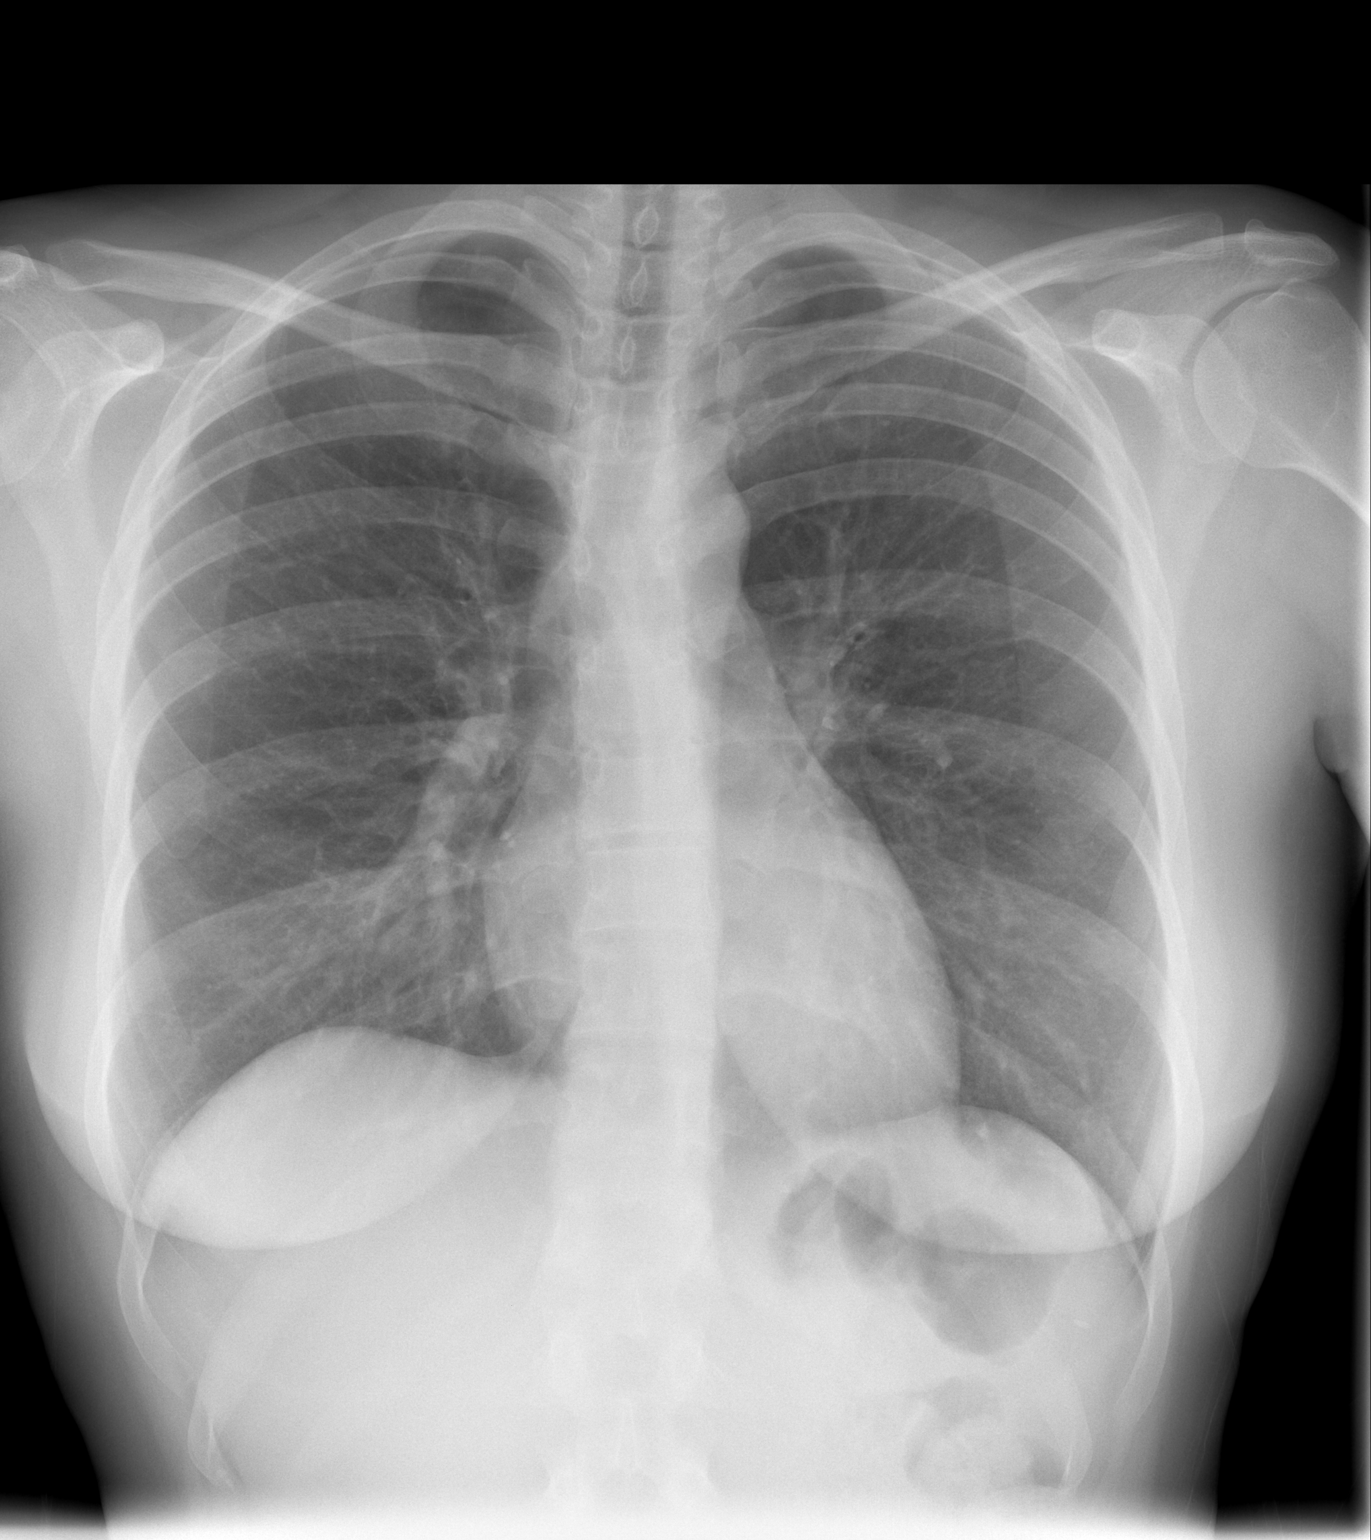

[w chest lat]
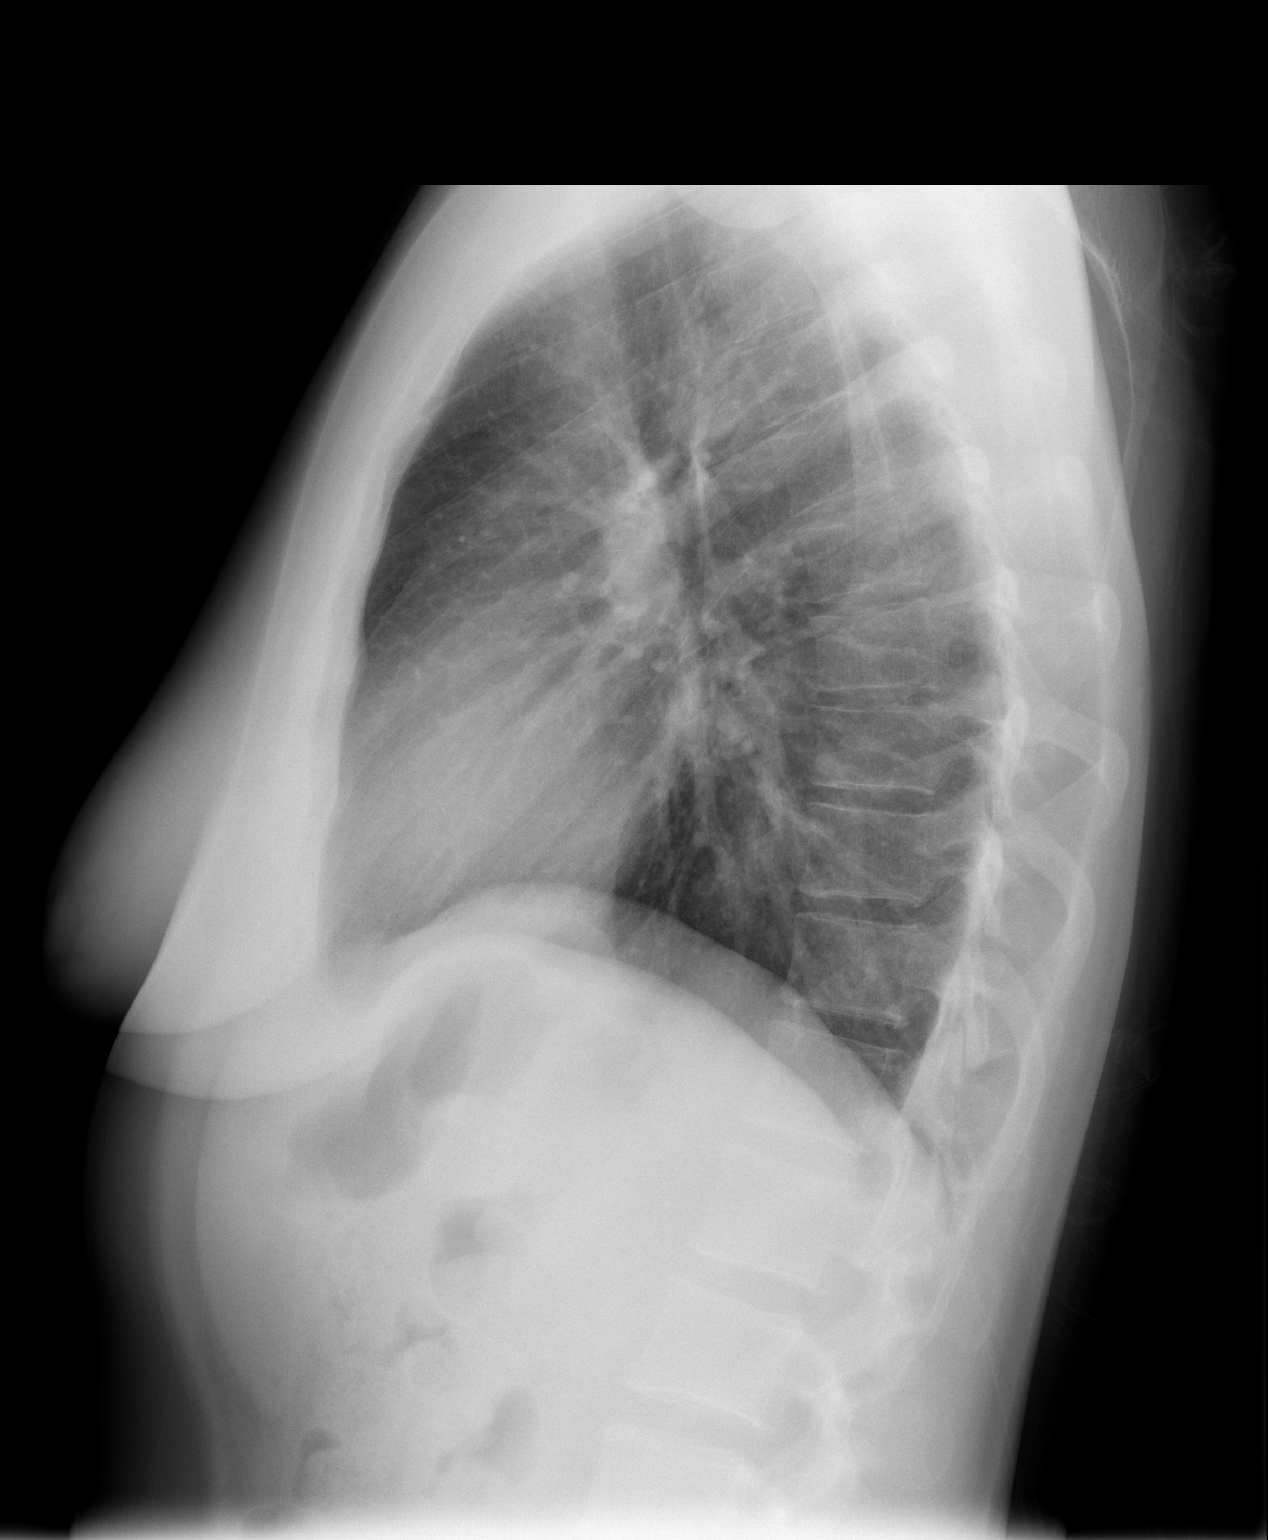

[2 of 2 positions shown; findings below may reference images not displayed]

FINDINGS: Lungs clear.  Heart size and pulmonary vascularity are
normal.  No adenopathy.  There is mild upper thoracic
dextroscoliosis.
IMPRESSION: No edema or consolidation.

## 2019-11-26 ENCOUNTER — Emergency Department (HOSPITAL_BASED_OUTPATIENT_CLINIC_OR_DEPARTMENT_OTHER): Payer: Medicaid Other

## 2019-11-26 ENCOUNTER — Other Ambulatory Visit: Payer: Self-pay

## 2019-11-26 ENCOUNTER — Encounter (HOSPITAL_BASED_OUTPATIENT_CLINIC_OR_DEPARTMENT_OTHER): Payer: Self-pay | Admitting: *Deleted

## 2019-11-26 ENCOUNTER — Emergency Department (HOSPITAL_BASED_OUTPATIENT_CLINIC_OR_DEPARTMENT_OTHER)
Admission: EM | Admit: 2019-11-26 | Discharge: 2019-11-26 | Disposition: A | Discharge status: Home / Self Care | Payer: Medicaid Other | Attending: Emergency Medicine | Admitting: Emergency Medicine

## 2019-11-26 DIAGNOSIS — Y92009 Unspecified place in unspecified non-institutional (private) residence as the place of occurrence of the external cause: Secondary | ICD-10-CM | POA: Insufficient documentation

## 2019-11-26 DIAGNOSIS — W268XXA Contact with other sharp object(s), not elsewhere classified, initial encounter: Secondary | ICD-10-CM | POA: Diagnosis not present

## 2019-11-26 DIAGNOSIS — S61012A Laceration without foreign body of left thumb without damage to nail, initial encounter: Secondary | ICD-10-CM | POA: Insufficient documentation

## 2019-11-26 DIAGNOSIS — F17210 Nicotine dependence, cigarettes, uncomplicated: Secondary | ICD-10-CM | POA: Insufficient documentation

## 2019-11-26 DIAGNOSIS — Z23 Encounter for immunization: Secondary | ICD-10-CM | POA: Insufficient documentation

## 2019-11-26 MED ORDER — TETANUS-DIPHTH-ACELL PERTUSSIS 5-2.5-18.5 LF-MCG/0.5 IM SUSY
0.5000 mL | PREFILLED_SYRINGE | Freq: Once | INTRAMUSCULAR | Status: AC
  Administered 2019-11-26: 0.5 mL via INTRAMUSCULAR
  Filled 2019-11-26: qty 0.5

## 2019-11-26 MED ORDER — LIDOCAINE HCL (PF) 1 % IJ SOLN
5.0000 mL | Freq: Once | INTRAMUSCULAR | Status: AC
  Administered 2019-11-26: 5 mL
  Filled 2019-11-26: qty 5

## 2019-11-26 NOTE — ED Triage Notes (Signed)
Laceration to left thumb 1 hour PTA. Bleeding controlled.

## 2019-11-26 NOTE — ED Provider Notes (Signed)
[facility] EMERGENCY DEPARTMENT Provider Note   CSN: [id number] Arrival date & time: 11/26/19  1811     History Chief Complaint  Patient presents with   Extremity Laceration    Janice Harrison is a 34 y.o. female.  HPI   Patient with no significant medical history presents to the emergency department with chief complaint of laceration to her left thumb.  Patient states she was opening a boxes at her house when the box cutter kicked back and cut the proximal end of her thumb.  She denies hitting her head, losing conscious, is not on any anticoag.  Patient last tetanus shot was in 2014 and she is not immunocompromise.  Patient states this happened approximately 5 PM today.  She states she was able to stop the bleeding when applying pressure.  She denies numbness or tingling in her thumb and is able to move without difficulty.  Patient denies headaches, fevers, chills, shortness of breath, chest pain, dumping, nausea, vomiting, diarrhea, pedal edema.  Past Medical History:  Diagnosis Date   Drug addiction [facility])     Patient Active Problem List   Diagnosis Date Noted   Cellulitis and abscess 06/02/2012   Heroin abuse (HCC) 06/02/2012   Tobacco use disorder 06/02/2012    Past Surgical History:  Procedure Laterality Date   CARPAL TUNNEL RELEASE Right    I & D EXTREMITY Right 06/02/2012   Procedure: IRRIGATION AND DEBRIDEMENT EXTREMITY;  Surgeon: Jodi Marble, MD;  Location: [facility] OR;  Service: Orthopedics;  Laterality: Right;   TUBAL LIGATION       OB History   No obstetric history on file.     Family History  Problem Relation Age of Onset   Hypertension Mother     Social History   Tobacco Use   Smoking status: Current Every Day Smoker    Packs/day: 0.50    Types: Cigarettes   Smokeless tobacco: Never Used  Substance Use Topics   Alcohol use: No   Drug use: Not Currently    Types: IV    Comment: heroin-last use 2014    Home  Medications Prior to Admission medications   Medication Sig Start Date End Date Taking? Authorizing Provider  DULoxetine (CYMBALTA) 60 MG capsule Take 60 mg by mouth daily.    [provider]  ibuprofen (ADVIL,MOTRIN) 200 MG tablet Take 600 mg by mouth 2 (two) times daily as needed for pain or headache.    [provider]    Allergies    Augmentin [amoxicillin-pot clavulanate] and Vancomycin  Review of Systems   Review of Systems  Constitutional: Negative for chills and fever.  HENT: Negative for congestion.   Respiratory: Negative for shortness of breath.   Cardiovascular: Negative for chest pain.  Gastrointestinal: Negative for abdominal pain.  Genitourinary: Negative for enuresis.  Musculoskeletal: Negative for back pain.  Skin: Positive for wound. Negative for rash.       Wound to the left thumb.  Neurological: Negative for dizziness.  Hematological: Does not bruise/bleed easily.    Physical Exam Updated Vital Signs BP 133/84 (BP Location: Left Arm)    Pulse 82    Temp 98.5 F (36.9 C) (Oral)    Resp 19    Ht 5\' 8"  (1.727 m)    Wt 83.9 kg    SpO2 100%    BMI 28.13 kg/m   Physical Exam Vitals and nursing note reviewed.  Constitutional:      General: She is not in  acute distress.    Appearance: Normal appearance. She is not ill-appearing or diaphoretic.  HENT:     Head: Normocephalic and atraumatic.     Nose: No congestion.  Eyes:     Conjunctiva/sclera: Conjunctivae normal.  Pulmonary:     Effort: Pulmonary effort is normal.  Musculoskeletal:     Cervical back: Neck supple.     Right lower leg: No edema.     Left lower leg: No edema.     Comments: Patient had full range of motion with her left thumb neurovascular is fully intact in left thumb.  Skin:    General: Skin is warm and dry.     Coloration: Skin is not jaundiced or pale.     Findings: Lesion present.     Comments: Patient had noted laceration at the proximal joint of her left thumb, it  measured 2 cm in length proxy 1 cm in depth, no ligament or tendon damage noted, hemodynamically stable.  Neurological:     Mental Status: She is alert.  Psychiatric:        Mood and Affect: Mood normal.     ED Results / Procedures / Treatments   Labs (all labs ordered are listed, but only abnormal results are displayed) Labs Reviewed - No data to display  EKG None  Radiology DG Hand Complete Left  Result Date: 11/26/2019 CLINICAL DATA:  34 year old female with left thumb laceration. EXAM: LEFT HAND - COMPLETE 3+ VIEW COMPARISON:  None. FINDINGS: Bone mineralization is within normal limits. 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 Distal radius and ulna otherwise appear intact. Carpal bones intact with normal joint spaces and alignment. Metacarpals and phalanges intact. No radiopaque foreign body identified. No discrete soft tissue injury identified. IMPRESSION: No acute radiographic finding in the left hand. Electronically Signed   By: Odessa Fleming M.D.   On: 11/26/2019 22:43    Procedures .Marland KitchenLaceration Repair  Date/Time: 11/26/2019 11:19 PM Performed by: Carroll Sage, PA-C Authorized by: Carroll Sage, PA-C   Consent:    Consent obtained:  Verbal   Consent given by:  Patient   Risks discussed:  Infection, pain, retained foreign body, need for additional repair, poor cosmetic result, tendon damage, vascular damage, poor wound healing and nerve damage   Alternatives discussed:  No treatment Anesthesia (see MAR for exact dosages):    Anesthesia method:  Local infiltration   Local anesthetic:  Lidocaine 1% w/o epi Laceration details:    Location:  Hand   Hand location:  L hand, dorsum   Length (cm):  2   Depth (mm):  1 Repair type:    Repair type:  Simple Pre-procedure details:    Preparation:  Patient was prepped and draped in usual sterile fashion and imaging obtained to evaluate for foreign bodies Exploration:    Wound exploration:  wound explored through full range of motion and entire depth of wound probed and visualized     Contaminated: no   Treatment:    Area cleansed with:  Saline   Amount of cleaning:  Standard   Irrigation method:  Syringe   Visualized foreign bodies/material removed: no   Skin repair:    Repair method:  Sutures   Suture size:  4-0   Suture material:  Prolene   Suture technique:  Simple interrupted   Number of sutures:  1 Approximation:    Approximation:  Loose Post-procedure details:    Dressing:  Bulky dressing   Patient tolerance  of procedure:  Tolerated well, no immediate complications Comments:     After the procedure patient motor, sensation, strength were all intact in her left thumb, area was soft to the touch with good capillary refill.  No signs of infection were noted, no rash, no ligament or tendon damage present.   (including critical care time)  Medications Ordered in ED Medications  Tdap (BOOSTRIX) injection 0.5 mL (0.5 mLs Intramuscular Given 11/26/19 2253)  lidocaine (PF) (XYLOCAINE) 1 % injection 5 mL (5 mLs Infiltration Given 11/26/19 2253)    ED Course  I have reviewed the triage vital signs and the nursing notes.  Pertinent labs & imaging results that were available during my care of the patient were reviewed by me and considered in my medical decision making (see chart for details).    MDM Rules/Calculators/A&P                          Patient presents with laceration to her left thumb.  She is alert, does appear in acute distress, vital signs reassuring.  Will obtain x-ray and provide Tdap for further evaluation.  X-ray does not reveal any acute findings.  Will recommend suture to reduce infection risk and to help with the healing process.  Patient agreed and tolerated procedure well.  She received 1 suture and dressing was applied.    Low suspicion for fracture or dislocation as x-ray does not reveal any significant findings. low suspicion for ligament or  tendon damage as area was palpated no gross defects noted, she had full range of motion before and after the procedure neurovascular fully intact.  low suspicion for compartment syndrome as area was palpated it was soft to the touch, neurovascular fully intact.  Patient received 1 suture will provide basic wound care and have her follow-up in 7 to 10 days for suture removal.  Patient's Tdap was updated last was in 2014 will defer antibiotics at this time as wound happened less than 5 hours ago, she is not immunocompromised.    Vital signs have remained stable, no indication for hospital admission.   Patient given at home care as well strict return precautions.  Patient verbalized that they understood agreed to said plan.    Final Clinical Impression(s) / ED Diagnoses Final diagnoses:  Laceration of left thumb without foreign body without damage to nail, initial encounter    Rx / DC Orders ED Discharge Orders    None       Barnie Del 11/26/19 2323    Tilden Fossa, MD 11/26/19 2325

## 2019-11-26 NOTE — Discharge Instructions (Addendum)
Seen here for a laceration.  You received 1 suture, please refrain from getting it wet for the first 24 hours.  After that I would like you to rinse it off and apply new dressings to it twice a day for the next 7 days.  You may take over-the-counter pain medications like ibuprofen or Tylenol every 6 hours hours please follow dosing the back of bottle.  It is imperative that you follow-up at an ED, urgent care, PCP in 7 to 10 days to have suture removed.  Come back to the emergency department if you develop chest pain, shortness of breath, severe abdominal pain, uncontrolled nausea, vomiting, diarrhea.

## 2021-03-01 IMAGING — DX DG HAND COMPLETE 3+V*L*
3 series · 3 of 3 positions shown · non-contrast
Comparison: None.

CLINICAL DATA: 34-year-old female with left thumb laceration.

EXAM:
LEFT HAND - COMPLETE 3+ VIEW

[hand ap]
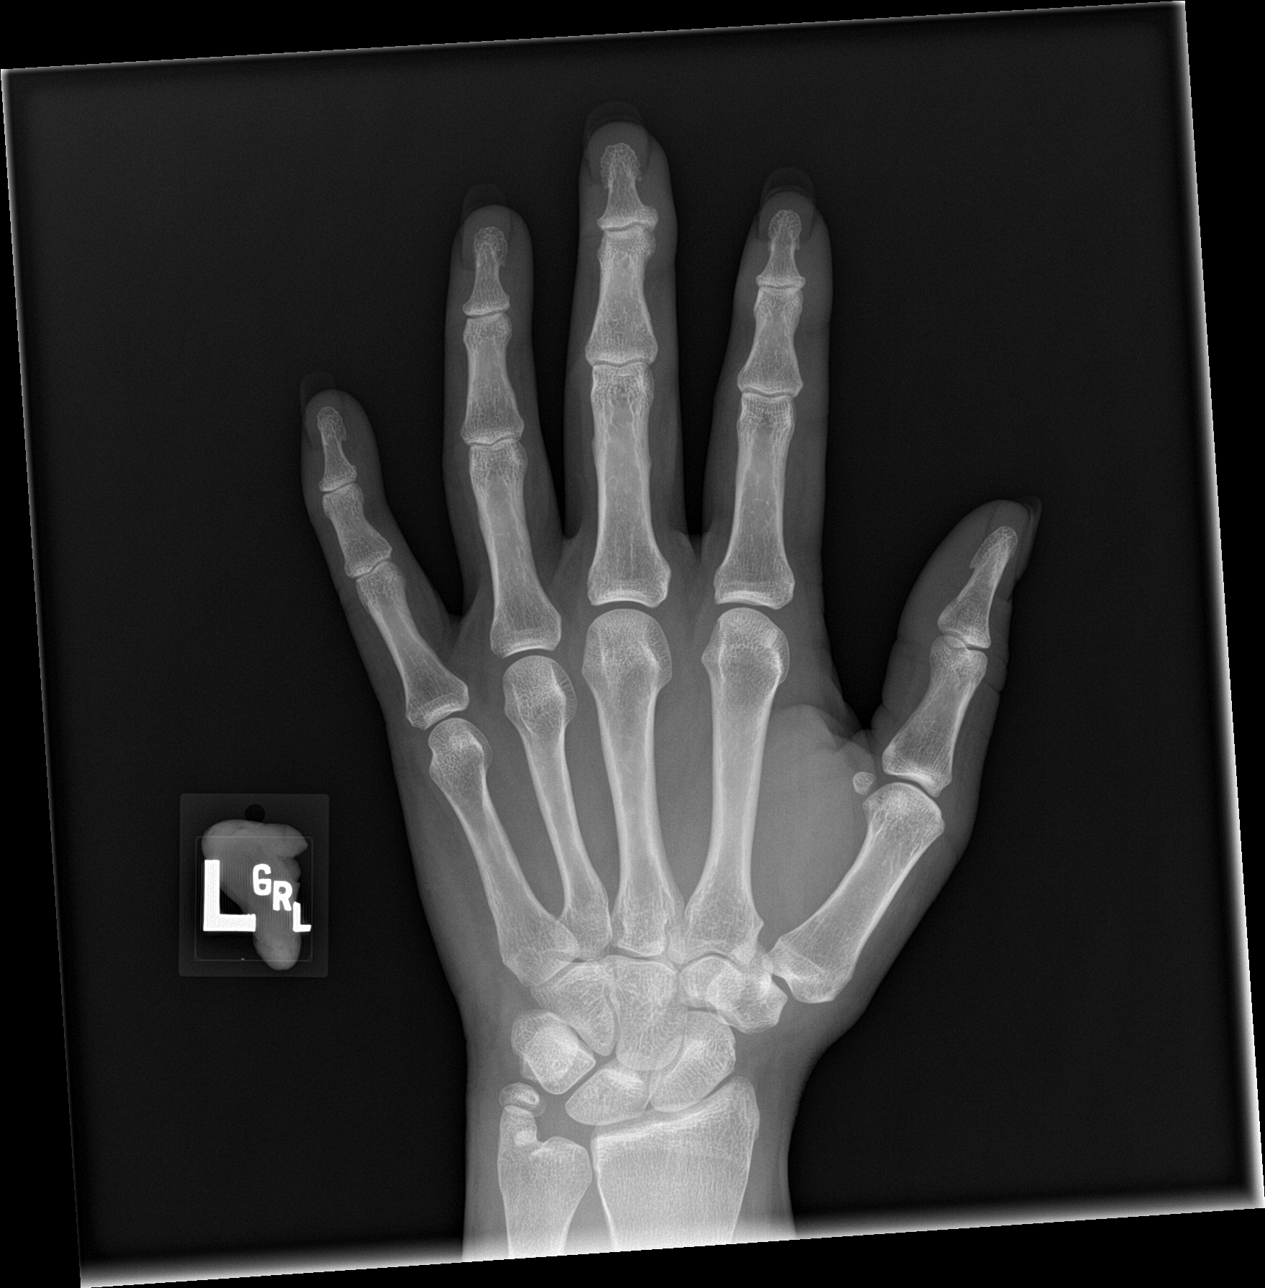

[hand obl]
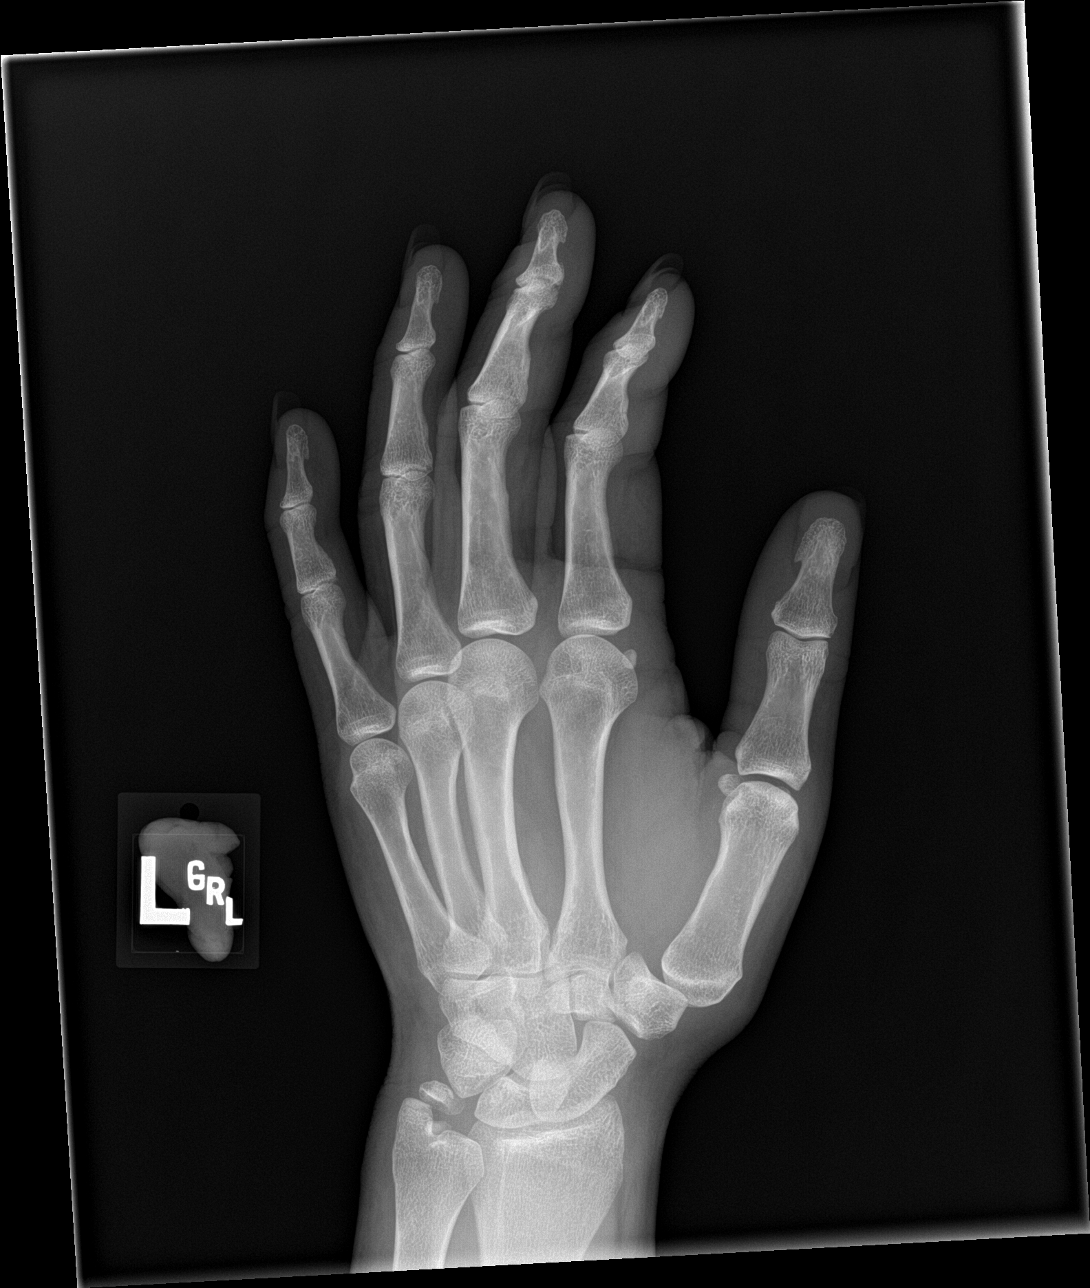

[hand lat]
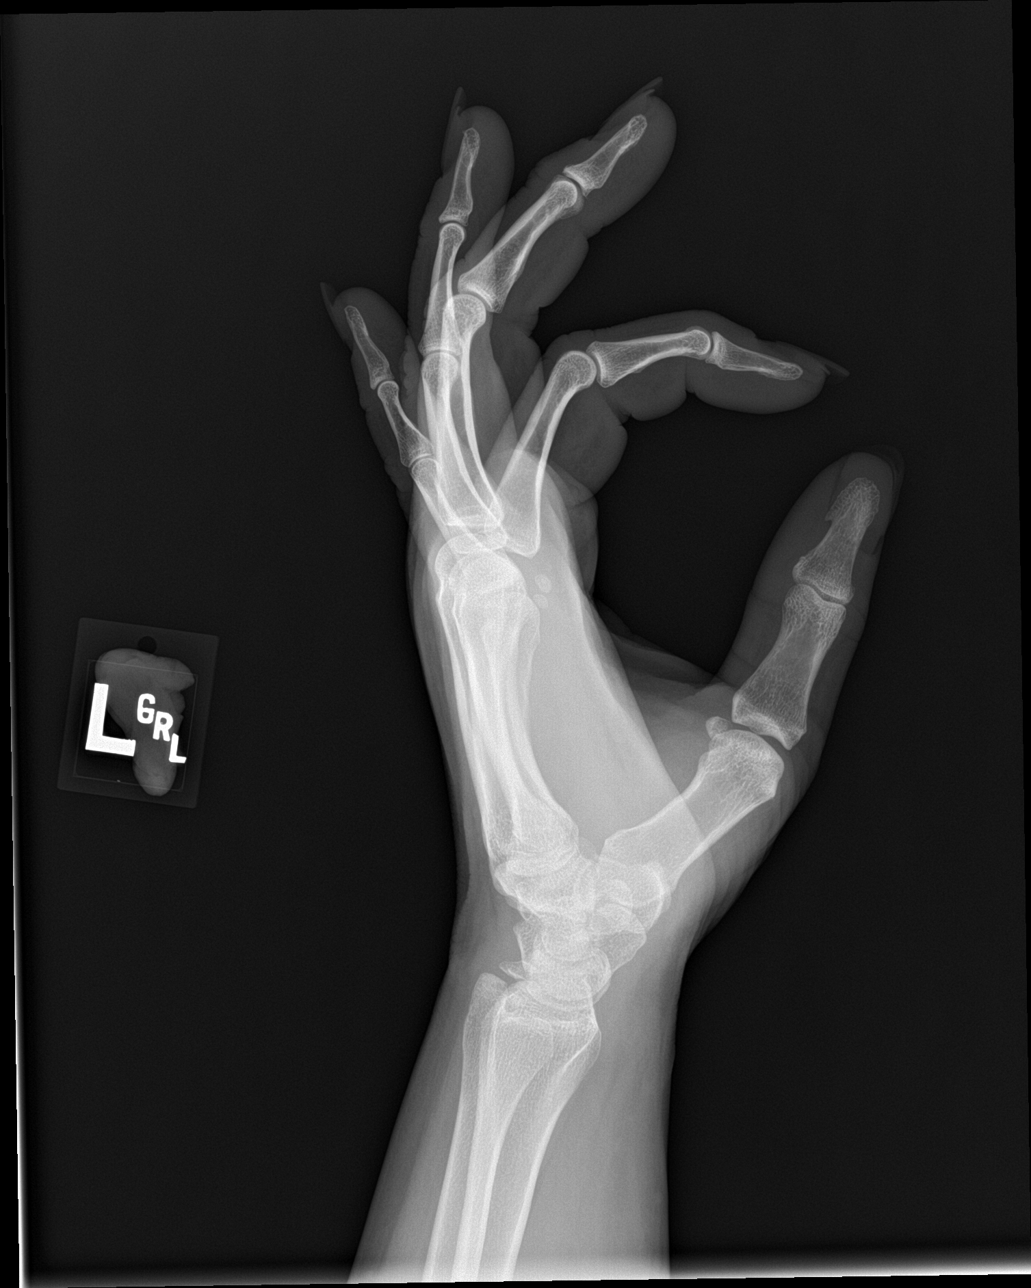

[3 of 3 positions shown; findings below may reference images not displayed]

FINDINGS: Bone mineralization is within normal limits. Chronic ununited
ossification center or fracture at the ulnar styloid. Distal radius
and ulna otherwise appear intact. Carpal bones intact with normal
joint spaces and alignment. Metacarpals and phalanges intact. No
radiopaque foreign body identified. No discrete soft tissue injury
identified.
IMPRESSION: No acute radiographic finding in the left hand.
